# Patient Record
Sex: Female | Born: 1990 | Race: White | Hispanic: No | Marital: Married | Smoking: Never smoker
Health system: Southern US, Community
[De-identification: ages and names within clinical notes are randomized; demographics above are authoritative.]

## PROBLEM LIST (undated history)

## (undated) DIAGNOSIS — F5000 Anorexia nervosa, unspecified: Secondary | ICD-10-CM

## (undated) DIAGNOSIS — J45909 Unspecified asthma, uncomplicated: Secondary | ICD-10-CM

## (undated) DIAGNOSIS — F419 Anxiety disorder, unspecified: Secondary | ICD-10-CM

## (undated) DIAGNOSIS — F909 Attention-deficit hyperactivity disorder, unspecified type: Secondary | ICD-10-CM

## (undated) DIAGNOSIS — L309 Dermatitis, unspecified: Secondary | ICD-10-CM

## (undated) DIAGNOSIS — N760 Acute vaginitis: Secondary | ICD-10-CM

## (undated) HISTORY — DX: Dermatitis, unspecified: L30.9

## (undated) HISTORY — DX: Acute vaginitis: N76.0

## (undated) HISTORY — DX: Anxiety disorder, unspecified: F41.9

## (undated) HISTORY — DX: Unspecified asthma, uncomplicated: J45.909

## (undated) HISTORY — DX: Anorexia nervosa, unspecified: F50.00

## (undated) HISTORY — DX: Attention-deficit hyperactivity disorder, unspecified type: F90.9

---
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
## 2012-01-12 ENCOUNTER — Ambulatory Visit (INDEPENDENT_AMBULATORY_CARE_PROVIDER_SITE_OTHER): Payer: 59 | Admitting: Psychiatry

## 2012-01-12 VITALS — BP 110/70 | Ht 60.0 in | Wt 141.0 lb

## 2012-01-12 DIAGNOSIS — F5000 Anorexia nervosa, unspecified: Secondary | ICD-10-CM

## 2012-01-12 DIAGNOSIS — F909 Attention-deficit hyperactivity disorder, unspecified type: Secondary | ICD-10-CM

## 2012-01-12 DIAGNOSIS — F411 Generalized anxiety disorder: Secondary | ICD-10-CM

## 2012-01-12 MED ORDER — METHYLPHENIDATE HCL ER (OSM) 27 MG PO TBCR: 27.0000 mg | EXTENDED_RELEASE_TABLET | ORAL | Status: DC

## 2012-01-12 MED ORDER — FLUOXETINE HCL 10 MG PO CAPS: 30.0000 mg | ORAL_CAPSULE | Freq: Every day | ORAL | Status: DC

## 2012-01-13 ENCOUNTER — Encounter (HOSPITAL_COMMUNITY): Payer: Self-pay | Admitting: Psychiatry

## 2012-01-13 NOTE — Progress Notes (Signed)
   [facility] Follow-up Outpatient Visit  Ariel Cervantes 1990-09-27   Subjective: The patient is an 21 year old female who has been followed by [facility] since August of 2010. She is currently diagnosed with anorexia nervosa along with generalized anxiety disorder. At her last appointment, I continued her Prozac. The patient restarted the Concerta when school started. She is now attending [employer]. It appears to be going well. She got a 96 on her first biology exam. She is taking 16 hours this semester. She has been a little bit more moody lately. She has been on birth control pills for approximately one and a half months. She is the same weight his last visit. She reports that her dad is asking if she can go up on her Prozac. She denies any restricting or binging. She has been exercising appropriately.  Filed Vitals:   01/13/12 0843  BP: 110/70    Mental Status Examination  Appearance: Casual Alert: Yes Attention: good  Cooperative: Yes Eye Contact: Good Speech: Regular rate rhythm and volume Psychomotor Activity: Normal Memory/Concentration: Intact Oriented: person, place, time/date and situation Mood: Euthymic Affect: Appropriate Thought Processes and Associations: Logical Fund of Knowledge: Fair Thought Content: No suicidal or homicidal thoughts Insight: Fair Judgement: Fair  Diagnosis: Anorexia nervosa, generalized anxiety disorder  Treatment Plan: We will increase the Prozac to 30 mg daily. I will continue Concerta 27 mg. I will see her back in 6 weeks. Patient may call with concerns. Jamse Mead, MD

## 2012-02-23 ENCOUNTER — Encounter (HOSPITAL_COMMUNITY): Payer: Self-pay | Admitting: Psychiatry

## 2012-02-23 ENCOUNTER — Ambulatory Visit (INDEPENDENT_AMBULATORY_CARE_PROVIDER_SITE_OTHER): Payer: 59 | Admitting: Psychiatry

## 2012-02-23 VITALS — BP 118/68 | Ht 60.0 in | Wt 142.0 lb

## 2012-02-23 DIAGNOSIS — F909 Attention-deficit hyperactivity disorder, unspecified type: Secondary | ICD-10-CM

## 2012-02-23 DIAGNOSIS — F5000 Anorexia nervosa, unspecified: Secondary | ICD-10-CM

## 2012-02-23 DIAGNOSIS — Z8659 Personal history of other mental and behavioral disorders: Secondary | ICD-10-CM

## 2012-02-23 DIAGNOSIS — F411 Generalized anxiety disorder: Secondary | ICD-10-CM

## 2012-02-23 MED ORDER — METHYLPHENIDATE HCL ER (OSM) 27 MG PO TBCR: 27.0000 mg | EXTENDED_RELEASE_TABLET | ORAL | Status: DC

## 2012-02-23 NOTE — Progress Notes (Signed)
   [facility] Follow-up Outpatient Visit  MISTEY HOFFERT [birth year]   Subjective: The patient is an 21 year old female who has been followed by [facility] since August of 2010. She is currently diagnosed with anorexia nervosa along with generalized anxiety disorder and ADHD inattentive type. At her last appointment, I increased her Prozac to 30 mg daily. I continued her Concerta 27 mg daily. The patient is doing well in school. She currently has a B. in biology but believe she can forget 2 and a with the lab. She is also taking developmental psychology, English, Spanish, and history. She still living at home. She is now adjusting to it. She was concerned about her weight because she feels that she eats more when she is home. She is only a pound today and that is since June. She does undergo test anxiety. She can sometimes psych herself out over exams. She sleeping well. She's been having bad dreams that she should not go to medical school. She is doing well with the increased dose of Prozac.  Filed Vitals:   02/23/12 1418  BP: 118/68    Mental Status Examination  Appearance: Casual Alert: Yes Attention: good  Cooperative: Yes Eye Contact: Good Speech: Regular rate rhythm and volume Psychomotor Activity: Normal Memory/Concentration: Intact Oriented: person, place, time/date and situation Mood: Euthymic Affect: Appropriate Thought Processes and Associations: Logical Fund of Knowledge: Fair Thought Content: No suicidal or homicidal thoughts Insight: Fair Judgement: Fair  Diagnosis: Anorexia nervosa, generalized anxiety disorder  Treatment Plan: I will continue the Prozac 30 mg daily. I will continue Concerta 27 mg. I will see her back in 6 weeks. Patient may call with concerns. Jamse Mead, MD

## 2012-05-24 ENCOUNTER — Ambulatory Visit (INDEPENDENT_AMBULATORY_CARE_PROVIDER_SITE_OTHER): Payer: 59 | Admitting: Psychiatry

## 2012-05-24 VITALS — BP 115/70 | Ht 60.0 in | Wt 139.0 lb

## 2012-05-24 DIAGNOSIS — F5000 Anorexia nervosa, unspecified: Secondary | ICD-10-CM

## 2012-05-24 DIAGNOSIS — F411 Generalized anxiety disorder: Secondary | ICD-10-CM

## 2012-05-24 DIAGNOSIS — F5001 Anorexia nervosa, restricting type: Secondary | ICD-10-CM

## 2012-05-24 MED ORDER — METHYLPHENIDATE HCL ER (OSM) 27 MG PO TBCR: 27.0000 mg | EXTENDED_RELEASE_TABLET | ORAL | Status: DC

## 2012-05-24 MED ORDER — METHYLPHENIDATE HCL ER (OSM) 27 MG PO TBCR
27.0000 mg | EXTENDED_RELEASE_TABLET | ORAL | Status: DC
Start: 2012-07-23 — End: 2015-07-15

## 2012-05-25 ENCOUNTER — Encounter (HOSPITAL_COMMUNITY): Payer: Self-pay | Admitting: Psychiatry

## 2012-05-25 NOTE — Progress Notes (Signed)
   [facility] Follow-up Outpatient Visit  XIADANI DAMMAN 1991-01-12   Subjective: The patient is an 22 year old female who has been followed by [facility] since August of 2010. She is currently diagnosed with anorexia nervosa along with generalized anxiety disorder and ADHD inattentive type. At her last appointment, I did not make any changes. She presents today. She made both Dean's List and Chancellor's List last semester. She is very proud of herself. She continues at [employer]. She is currently enrolled in 17 hours. She has been talking to obtain time. Her parents did not know. She finds her parents to continue to be overprotective. Her dad got upset the other day because she did not get home until 11:30. She had gone to a movie. The patient finds that when she has school from 9 AM until 2 PM, she comes home exhausted. She requires a nap. She is wondering if this is related to her medication. The patient endorses good sleep and appetite. She is down 3 pounds today, which she surprised about. She states she ate a lot over winter break. She is actually eating like a normal teenager.  Filed Vitals:   05/25/12 0847  BP: 115/70    Mental Status Examination  Appearance: Casual Alert: Yes Attention: good  Cooperative: Yes Eye Contact: Good Speech: Regular rate rhythm and volume Psychomotor Activity: Normal Memory/Concentration: Intact Oriented: person, place, time/date and situation Mood: Euthymic Affect: Appropriate Thought Processes and Associations: Logical Fund of Knowledge: Fair Thought Content: No suicidal or homicidal thoughts Insight: Fair Judgement: Fair  Diagnosis: Anorexia nervosa, generalized anxiety disorder  Treatment Plan: I will continue the Prozac 30 mg daily and the Concerta 27 mg. I will see her back in 3 months. Patient may call with concerns. Jamse Mead, MD

## 2012-07-31 ENCOUNTER — Other Ambulatory Visit (HOSPITAL_COMMUNITY): Payer: Self-pay | Admitting: Psychiatry

## 2012-08-23 ENCOUNTER — Ambulatory Visit (INDEPENDENT_AMBULATORY_CARE_PROVIDER_SITE_OTHER): Payer: 59 | Admitting: Psychiatry

## 2012-08-23 ENCOUNTER — Encounter (HOSPITAL_COMMUNITY): Payer: Self-pay | Admitting: Psychiatry

## 2012-08-23 VITALS — BP 105/68 | Ht 60.0 in | Wt 142.0 lb

## 2012-08-23 DIAGNOSIS — F411 Generalized anxiety disorder: Secondary | ICD-10-CM

## 2012-08-23 DIAGNOSIS — F909 Attention-deficit hyperactivity disorder, unspecified type: Secondary | ICD-10-CM

## 2012-08-23 DIAGNOSIS — F5000 Anorexia nervosa, unspecified: Secondary | ICD-10-CM

## 2012-08-23 MED ORDER — METHYLPHENIDATE HCL ER (OSM) 27 MG PO TBCR: 27.0000 mg | EXTENDED_RELEASE_TABLET | ORAL | Status: DC

## 2012-08-23 MED ORDER — FLUOXETINE HCL 40 MG PO CAPS: 40.0000 mg | ORAL_CAPSULE | Freq: Every day | ORAL | Status: DC

## 2012-08-23 NOTE — Progress Notes (Signed)
Health Follow-up Outpatient Visit  CASSARA NIDA February 08, 1991   Subjective: The patient is an 22 year old female who has been followed by [facility] since August of 2010. She is currently diagnosed with anorexia nervosa along with generalized anxiety disorder and ADHD inattentive type. At her last appointment, I did not make any changes. She presents today. She is struggling more this semester. She dropped a three-hour class that she's only taking 14 hours. Her anxiety has been higher. She plans on taking organic chemistry in the fall. She's been dating since January. It appears to be going well. Appetite is good. She is actually up 3 pounds at today's visit. She is careful not to over monitor. She is sleeping well. She has been spending a lot of time studying. She was at [employer] yesterday for 5 hours. Afterwards she went out with a friend. She did not get home until 11 PM. Dad was not happy about this. She and mom are doing well. Dad is back to working full time, which is a good thing. The patient continues to live at home.  Filed Vitals:   08/23/12 1424  BP: 105/68   Active Ambulatory Problems    Diagnosis Date Noted  . Anorexia nervosa, restricting type 07/10/2011  . GAD (generalized anxiety disorder) 07/10/2011   Resolved Ambulatory Problems    Diagnosis Date Noted  . No Resolved Ambulatory Problems   Past Medical History  Diagnosis Date  . ADHD (attention deficit hyperactivity disorder)   . Anxiety   . Anorexia nervosa    Current Outpatient Prescriptions on File Prior to Visit  Medication Sig Dispense Refill  . AVIANE 0.1-20 MG-MCG tablet       . methylphenidate (CONCERTA) 27 MG CR tablet Take 1 tablet (27 mg total) by mouth every morning.  30 tablet  0  . methylphenidate (CONCERTA) 27 MG CR tablet Take 1 tablet (27 mg total) by mouth every morning.  30 tablet  0  . methylphenidate (CONCERTA) 27 MG CR tablet Take 1 tablet (27 mg total)  by mouth every morning. Fill after 02/11/12  30 tablet  0  . methylphenidate (CONCERTA) 27 MG CR tablet Take 1 tablet (27 mg total) by mouth every morning.  30 tablet  0  . methylphenidate (CONCERTA) 27 MG CR tablet Take 1 tablet (27 mg total) by mouth every morning. Fill after 03/24/12  30 tablet  0  . methylphenidate (CONCERTA) 27 MG CR tablet Take 1 tablet (27 mg total) by mouth every morning. Fill after 04/23/12  30 tablet  0  . methylphenidate (CONCERTA) 27 MG CR tablet Take 1 tablet (27 mg total) by mouth every morning.  30 tablet  0  . methylphenidate (CONCERTA) 27 MG CR tablet Take 1 tablet (27 mg total) by mouth every morning. Fill after 06/23/12  30 tablet  0  . methylphenidate (CONCERTA) 27 MG CR tablet Take 1 tablet (27 mg total) by mouth every morning. Fill after 07/23/12  30 tablet  0  . [DISCONTINUED] traZODone (DESYREL) 50 MG tablet Take 50 mg by mouth at bedtime. 1-2 QHS       No current facility-administered medications on file prior to visit.  Review of Systems - General ROS: negative for - sleep disturbance or weight loss Psychological ROS: positive for - anxiety Cardiovascular ROS: no chest pain or dyspnea on exertion Musculoskeletal ROS: negative for - gait disturbance or muscular weakness Neurological ROS: negative for - headaches or seizures  Mental  Status Examination  Appearance: Casual Alert: Yes Attention: good  Cooperative: Yes Eye Contact: Good Speech: Regular rate rhythm and volume Psychomotor Activity: Normal Memory/Concentration: Intact Oriented: person, place, time/date and situation Mood: Euthymic Affect: Appropriate Thought Processes and Associations: Logical Fund of Knowledge: Fair Thought Content: No suicidal or homicidal thoughts Insight: Fair Judgement: Fair  Diagnosis: Anorexia nervosa, generalized anxiety disorder  Treatment Plan: I will increase Prozac to 40 mg daily. I will continue the Concerta 27 mg. I will see her back in 6 weeks.  Patient may call with concerns. Jamse Mead, MD

## 2012-10-04 ENCOUNTER — Ambulatory Visit (HOSPITAL_COMMUNITY): Payer: Self-pay | Admitting: Psychiatry

## 2012-10-12 ENCOUNTER — Encounter (HOSPITAL_COMMUNITY): Payer: Self-pay | Admitting: Psychiatry

## 2012-10-12 ENCOUNTER — Ambulatory Visit (INDEPENDENT_AMBULATORY_CARE_PROVIDER_SITE_OTHER): Payer: 59 | Admitting: Psychiatry

## 2012-10-12 VITALS — BP 102/64 | Ht 60.0 in | Wt 138.0 lb

## 2012-10-12 DIAGNOSIS — F411 Generalized anxiety disorder: Secondary | ICD-10-CM

## 2012-10-12 DIAGNOSIS — F909 Attention-deficit hyperactivity disorder, unspecified type: Secondary | ICD-10-CM

## 2012-10-12 MED ORDER — METHYLPHENIDATE HCL ER (CD) 10 MG PO CPCR: 10.0000 mg | ORAL_CAPSULE | ORAL | Status: DC

## 2012-10-12 NOTE — Progress Notes (Signed)
[facility] Follow-up Outpatient Visit  Ariel Cervantes June 27, 1990   Subjective: The patient is an 22 year old female who has been followed by [facility] since August of 2010. She is currently diagnosed with anorexia nervosa along with generalized anxiety disorder and ADHD inattentive type. At her last appointment, I did not make any changes. She presents today. She completed her spring semester. She has all A's and B's except for a C. plus. She is maintained a 3.4 GPA. She is no longer dating. She does not feel like he was a right fit. She has a job for the summer working as a Conservation officer, nature at [employer]. The patient brings a letter from [employer]. Her stimulants as the 50s are co-pay. The letter suggests a lower cost stimulants. Parents are interested. The patient feels that she's doing better since the increase in the Prozac. Her weight is down 4 pounds today. There has been no imaging or purging. She has not been restricting. Overall she feels that she's doing well.  Filed Vitals:   10/12/12 1335  BP: 102/64   Active Ambulatory Problems    Diagnosis Date Noted  . Anorexia nervosa, restricting type 07/10/2011  . GAD (generalized anxiety disorder) 07/10/2011   Resolved Ambulatory Problems    Diagnosis Date Noted  . No Resolved Ambulatory Problems   Past Medical History  Diagnosis Date  . ADHD (attention deficit hyperactivity disorder)   . Anxiety   . Anorexia nervosa    Current Outpatient Prescriptions on File Prior to Visit  Medication Sig Dispense Refill  . AVIANE 0.1-20 MG-MCG tablet       . FLUoxetine (PROZAC) 40 MG capsule Take 1 capsule (40 mg total) by mouth daily.  90 capsule  1  . methylphenidate (CONCERTA) 27 MG CR tablet Take 1 tablet (27 mg total) by mouth every morning.  30 tablet  0  . methylphenidate (CONCERTA) 27 MG CR tablet Take 1 tablet (27 mg total) by mouth every morning.  30 tablet  0  . methylphenidate (CONCERTA)  27 MG CR tablet Take 1 tablet (27 mg total) by mouth every morning. Fill after 02/11/12  30 tablet  0  . methylphenidate (CONCERTA) 27 MG CR tablet Take 1 tablet (27 mg total) by mouth every morning.  30 tablet  0  . methylphenidate (CONCERTA) 27 MG CR tablet Take 1 tablet (27 mg total) by mouth every morning. Fill after 03/24/12  30 tablet  0  . methylphenidate (CONCERTA) 27 MG CR tablet Take 1 tablet (27 mg total) by mouth every morning. Fill after 04/23/12  30 tablet  0  . methylphenidate (CONCERTA) 27 MG CR tablet Take 1 tablet (27 mg total) by mouth every morning.  30 tablet  0  . methylphenidate (CONCERTA) 27 MG CR tablet Take 1 tablet (27 mg total) by mouth every morning. Fill after 06/23/12  30 tablet  0  . methylphenidate (CONCERTA) 27 MG CR tablet Take 1 tablet (27 mg total) by mouth every morning. Fill after 07/23/12  30 tablet  0  . methylphenidate (CONCERTA) 27 MG CR tablet Take 1 tablet (27 mg total) by mouth every morning.  30 tablet  0  . methylphenidate (CONCERTA) 27 MG CR tablet Take 1 tablet (27 mg total) by mouth every morning. Fill after 09/22/12  30 tablet  0  . [DISCONTINUED] traZODone (DESYREL) 50 MG tablet Take 50 mg by mouth at bedtime. 1-2 QHS       No  current facility-administered medications on file prior to visit.  Review of Systems - General ROS: negative for - sleep disturbance or weight loss Psychological ROS: positive for - anxiety Cardiovascular ROS: no chest pain or dyspnea on exertion Musculoskeletal ROS: negative for - gait disturbance or muscular weakness Neurological ROS: negative for - headaches or seizures  Mental Status Examination  Appearance: Casual Alert: Yes Attention: good  Cooperative: Yes Eye Contact: Good Speech: Regular rate rhythm and volume Psychomotor Activity: Normal Memory/Concentration: Intact Oriented: person, place, time/date and situation Mood: Euthymic Affect: Appropriate Thought Processes and Associations: Logical Fund of  Knowledge: Fair Thought Content: No suicidal or homicidal thoughts Insight: Fair Judgement: Fair  Diagnosis: Anorexia nervosa, generalized anxiety disorder  Treatment Plan: I will discontinue Concerta.  I will start Metadate CD 10 mg.  I will continue Prozac 40 mg daily. I will see her back in 2 months. Patient update in 3 weeks. Patient may call with concerns. Jamse Mead, MD

## 2012-12-13 ENCOUNTER — Ambulatory Visit (INDEPENDENT_AMBULATORY_CARE_PROVIDER_SITE_OTHER): Payer: 59 | Admitting: Psychiatry

## 2012-12-13 ENCOUNTER — Encounter (HOSPITAL_COMMUNITY): Payer: Self-pay | Admitting: Psychiatry

## 2012-12-13 VITALS — BP 108/72 | Ht 60.0 in | Wt 138.0 lb

## 2012-12-13 DIAGNOSIS — F909 Attention-deficit hyperactivity disorder, unspecified type: Secondary | ICD-10-CM

## 2012-12-13 DIAGNOSIS — F411 Generalized anxiety disorder: Secondary | ICD-10-CM

## 2012-12-13 DIAGNOSIS — F5001 Anorexia nervosa, restricting type: Secondary | ICD-10-CM

## 2012-12-13 DIAGNOSIS — F5000 Anorexia nervosa, unspecified: Secondary | ICD-10-CM

## 2012-12-13 MED ORDER — METHYLPHENIDATE HCL ER (OSM) 27 MG PO TBCR: 27.0000 mg | EXTENDED_RELEASE_TABLET | ORAL | Status: DC

## 2012-12-13 NOTE — Progress Notes (Signed)
[facility] Follow-up Outpatient Visit  Ariel Cervantes 08/17/1990   Subjective: The patient is an 22 year-old female who has been followed by [facility] since August of 2010. She is currently diagnosed with anorexia nervosa along with generalized anxiety disorder and ADHD inattentive type. At her last appointment, I switched her to shorter acting Metadate CD rather than Concerta secondary to co-pay issues. The insurance refused the Metadate CD. Patient presents today. She never started the Metadate CD. She has continued on the Prozac for the summer. The patient will be going back to school on Monday. She continues to work at [employer]. The patient is getting out and being social. She is signed up for all morning classes so she may continue to work. The patient plans on joining an honor society along with rushing a sorority. She is sleeping and eating well. Her weight is the same as last appointment. The patient would rather just stick with the Concerta for her stimulant. She feels that she does well with it.  Filed Vitals:   12/13/12 1536  BP: 108/72   Active Ambulatory Problems    Diagnosis Date Noted  . Anorexia nervosa, restricting type 07/10/2011  . GAD (generalized anxiety disorder) 07/10/2011   Resolved Ambulatory Problems    Diagnosis Date Noted  . No Resolved Ambulatory Problems   Past Medical History  Diagnosis Date  . ADHD (attention deficit hyperactivity disorder)   . Anxiety   . Anorexia nervosa    Current Outpatient Prescriptions on File Prior to Visit  Medication Sig Dispense Refill  . AVIANE 0.1-20 MG-MCG tablet       . FLUoxetine (PROZAC) 40 MG capsule Take 1 capsule (40 mg total) by mouth daily.  90 capsule  1  . methylphenidate (CONCERTA) 27 MG CR tablet Take 1 tablet (27 mg total) by mouth every morning.  30 tablet  0  . methylphenidate (CONCERTA) 27 MG CR tablet Take 1 tablet (27 mg total) by mouth every morning.  30 tablet   0  . methylphenidate (CONCERTA) 27 MG CR tablet Take 1 tablet (27 mg total) by mouth every morning.  30 tablet  0  . methylphenidate (CONCERTA) 27 MG CR tablet Take 1 tablet (27 mg total) by mouth every morning. Fill after 03/24/12  30 tablet  0  . methylphenidate (CONCERTA) 27 MG CR tablet Take 1 tablet (27 mg total) by mouth every morning. Fill after 04/23/12  30 tablet  0  . methylphenidate (CONCERTA) 27 MG CR tablet Take 1 tablet (27 mg total) by mouth every morning.  30 tablet  0  . methylphenidate (CONCERTA) 27 MG CR tablet Take 1 tablet (27 mg total) by mouth every morning. Fill after 06/23/12  30 tablet  0  . methylphenidate (CONCERTA) 27 MG CR tablet Take 1 tablet (27 mg total) by mouth every morning. Fill after 07/23/12  30 tablet  0  . methylphenidate (CONCERTA) 27 MG CR tablet Take 1 tablet (27 mg total) by mouth every morning.  30 tablet  0  . methylphenidate (METADATE CD) 10 MG CR capsule Take 1 capsule (10 mg total) by mouth every morning.  30 capsule  0  . [DISCONTINUED] traZODone (DESYREL) 50 MG tablet Take 50 mg by mouth at bedtime. 1-2 QHS       No current facility-administered medications on file prior to visit.  Review of Systems - General ROS: negative for - sleep disturbance or weight loss Psychological ROS: positive for -  anxiety Cardiovascular ROS: no chest pain or dyspnea on exertion Musculoskeletal ROS: negative for - gait disturbance or muscular weakness Neurological ROS: negative for - headaches or seizures  Mental Status Examination  Appearance: Casual Alert: Yes Attention: good  Cooperative: Yes Eye Contact: Good Speech: Regular rate rhythm and volume Psychomotor Activity: Normal Memory/Concentration: Intact Oriented: person, place, time/date and situation Mood: Euthymic Affect: Appropriate Thought Processes and Associations: Logical Fund of Knowledge: Fair Thought Content: No suicidal or homicidal thoughts Insight: Fair Judgement: Fair  Diagnosis:  Anorexia nervosa, generalized anxiety disorder  Treatment Plan: I will continue Concerta 27 mg daily.  I will continue Prozac 40 mg daily. I will see her back in 3 months.  Patient may call with concerns. Jamse Mead, MD

## 2013-03-15 ENCOUNTER — Ambulatory Visit (INDEPENDENT_AMBULATORY_CARE_PROVIDER_SITE_OTHER): Payer: 59 | Admitting: Psychiatry

## 2013-03-15 ENCOUNTER — Ambulatory Visit (HOSPITAL_COMMUNITY): Payer: Self-pay | Admitting: Psychiatry

## 2013-03-15 ENCOUNTER — Encounter (HOSPITAL_COMMUNITY): Payer: Self-pay | Admitting: Psychiatry

## 2013-03-15 VITALS — BP 107/56 | HR 60 | Ht 60.0 in | Wt 142.0 lb

## 2013-03-15 DIAGNOSIS — F5000 Anorexia nervosa, unspecified: Secondary | ICD-10-CM

## 2013-03-15 DIAGNOSIS — F411 Generalized anxiety disorder: Secondary | ICD-10-CM

## 2013-03-15 DIAGNOSIS — F5001 Anorexia nervosa, restricting type: Secondary | ICD-10-CM

## 2013-03-15 MED ORDER — METHYLPHENIDATE HCL ER (OSM) 27 MG PO TBCR: 27.0000 mg | EXTENDED_RELEASE_TABLET | ORAL | Status: DC

## 2013-03-15 MED ORDER — FLUOXETINE HCL 40 MG PO CAPS: 40.0000 mg | ORAL_CAPSULE | Freq: Every day | ORAL | Status: DC

## 2013-03-15 NOTE — Progress Notes (Signed)
[facility] [id number] Progress Note  Ariel Cervantes [id number] 22 y.o.  03/15/2013 3:16 PM  Chief Complaint: Follow up  History of Present Illness: HPI Comments: Ariel Cervantes is  a 22 y/o female with a past psychiatric history significant for Anorexia nervosa, restricting type,   Generalized Anxiety Disorder. The patient is referred for psychiatric services for psychiatric evaluation and medication management.    . Location: The patient reports she has been doing well over.  . Quality: The patient reports she has been in school since August.  She has not been taking Concerta on a regular basis.   The patient reports that her main stressors are:   In the area of affective symptoms, patient appears midly anxious. Patient denies current suicidal ideation, intent, or plan. Patient denies current homicidal ideation, intent, or plan. Patient denies auditory hallucinations. Patient endorses/denies visual hallucinations. Patient denies symptoms of paranoia. Patient states sleep is good, with approximately 6-8 hours of sleep per night. Appetite is good. Energy level is good. Patient denies symptoms of anhedonia. Patient denies hopelessness, helplessness, or guilt.   . Severity; Depression: 7/10 (0=Very depressed; 5=Neutral; 10=Very Happy)  Anxiety- 5/10 (0=no anxiety; 5= moderate/tolerable anxiety; 10= panic attacks)  . Duration: Patient reports symptoms since the age.  . Timing-Worse around exam days.  . Context: Arguments with her father.  . Modifying factors: Thinking about happy things.  . Associated signs and symptoms (e.g., loss of appetite, loss of weight, loss of sexual interest)  Denies any recent episodes consistent with mania, particularly decreased need for sleep with increased energy, grandiosity, impulsivity, hyperverbal and pressured speech, or increased productivity. Denies any recent symptoms consistent with psychosis, particularly auditory or visual  hallucinations, thought broadcasting/insertion/withdrawal, or ideas of reference. Also denies  any panic attacks. Denies any history of trauma or symptoms consistent with PTSD such as flashbacks, nightmares, hypervigilance, feelings of numbness or inability to connect with others.   Suicidal Ideation: Negative Plan Formed: Negative Patient has means to carry out plan: Negative  Homicidal Ideation: Negative Plan Formed: Negative Patient has means to carry out plan: Negative  Review of Systems: Psychiatric: Agitation: Negative Hallucination: Negative Depressed Mood: Negative Insomnia: Negative Hypersomnia: Negative Altered Concentration: Negative Feels Worthless: Negative Grandiose Ideas: Negative Belief In Special Powers: Negative New/Increased Substance Abuse: Negative Compulsions: Negative  Neurologic: Headache: Negative Seizure: Negative Paresthesias: Negative  Past Medical Family, Social History:  Active Ambulatory Problems    Diagnosis Date Noted  . Anorexia nervosa, restricting type 07/10/2011  . GAD (generalized anxiety disorder) 07/10/2011   Resolved Ambulatory Problems    Diagnosis Date Noted  . No Resolved Ambulatory Problems   Past Medical History  Diagnosis Date  . ADHD (attention deficit hyperactivity disorder)   . Anxiety   . Anorexia nervosa    Family History  Problem Relation Age of Onset  . Depression Father   . Anxiety disorder Father      Outpatient Encounter Prescriptions as of 03/15/2013  Medication Sig  . AVIANE 0.1-20 MG-MCG tablet   . FLUoxetine (PROZAC) 40 MG capsule Take 1 capsule (40 mg total) by mouth daily.  . methylphenidate (CONCERTA) 27 MG CR tablet Take 1 tablet (27 mg total) by mouth every morning.  . methylphenidate (CONCERTA) 27 MG CR tablet Take 1 tablet (27 mg total) by mouth every morning.  . methylphenidate (CONCERTA) 27 MG CR tablet Take 1 tablet (27 mg total) by mouth every morning.  . methylphenidate (CONCERTA) 27 MG CR  tablet Take 1 tablet (27  mg total) by mouth every morning. Fill after 03/24/12  . methylphenidate (CONCERTA) 27 MG CR tablet Take 1 tablet (27 mg total) by mouth every morning. Fill after 04/23/12  . methylphenidate (CONCERTA) 27 MG CR tablet Take 1 tablet (27 mg total) by mouth every morning.  . methylphenidate (CONCERTA) 27 MG CR tablet Take 1 tablet (27 mg total) by mouth every morning. Fill after 06/23/12  . methylphenidate (CONCERTA) 27 MG CR tablet Take 1 tablet (27 mg total) by mouth every morning. Fill after 07/23/12  . methylphenidate (CONCERTA) 27 MG CR tablet Take 1 tablet (27 mg total) by mouth every morning.  . methylphenidate (CONCERTA) 27 MG CR tablet Take 1 tablet (27 mg total) by mouth every morning.  . methylphenidate (CONCERTA) 27 MG CR tablet Take 1 tablet (27 mg total) by mouth every morning.  . methylphenidate (CONCERTA) 27 MG CR tablet Take 1 tablet (27 mg total) by mouth every morning.  . methylphenidate (METADATE CD) 10 MG CR capsule Take 1 capsule (10 mg total) by mouth every morning.    Past Psychiatric History/Hospitalization(s): Anxiety: Negative Bipolar Disorder: Negative Depression: Negative Mania: Negative Psychosis: Negative Schizophrenia: Negative Personality Disorder: Negative Hospitalization for psychiatric illness: Negative History of Electroconvulsive Shock Therapy: Negative Prior Suicide Attempts: Negative  Physical Exam: Constitutional:  BP 107/56  Pulse 60  Ht 5' (1.524 m)  Wt 142 lb (64.411 kg)  BMI 27.73 kg/m2  LMP 03/15/2013  General Appearance: alert, oriented, no acute distress and well nourished  Musculoskeletal: Strength & Muscle Tone: within normal limits Gait & Station: normal Patient leans: N/A  Psychiatric: General Appearance: Casual and Well Groomed  Patent attorney::  Negative  Speech:  Negative  Volume:  Normal  Mood:  Euthymic  Affect:  Negative  Thought Process:  Negative  Orientation:  Full (Time, Place, and Person)   Thought Content:  WDL  Suicidal Thoughts:  No  Homicidal Thoughts:  No  Memory:  Immediate;   Good Recent;   Fair Remote;   Fair  Judgement:  Fair  Insight:  Fair  Psychomotor Activity:  Normal  Concentration:  Negative  Recall:  Negative  Akathisia:  Negative  Handed:  Right  AIMS (if indicated):   Not indicated, no abnormal movement  Assets:  Communication Skills Desire for Improvement Financial Resources/Insurance Housing Leisure Time Physical Health Resilience Social Support Talents/Skills Transportation Vocational/Educational  Sleep:  Number of Hours:     Medical Decision Making (Choose Three): Established Problem, Stable/Improving (1) and Review of Medication Regimen & Side Effects (2)  Assessment: Axis I: Anorexia nervosa, restricting type, Generalized Anxiety Disorder  Plan:   Plan of Care:  PLAN:  1. Affirm with the patient that the medications are taken as ordered. Patient  expressed understanding of how their medications were to be used.    Laboratory:  No labs warranted at this time.    Psychotherapy: Therapy: brief supportive therapy provided.  Discussed psychosocial stressors in detail.  Will refer to individual therapy. Continue individual therapy.  Medications:  Continue the following psychiatric medications as written prior to this appointment with the following changes:  a) FLUoxetine (PROZAC) 40 MG capsule b) methylphenidate (CONCERTA) 27 MG CR tablet  -Risks and benefits, side effects and alternatives discussed with patient, she was given an opportunity to ask questions about his/her medication, illness, and treatment. All current psychiatric medications have been reviewed and discussed with the patient and adjusted as clinically appropriate. The patient has been provided an accurate and updated list of  the medications being now prescribed.   Routine PRN Medications:  Negative  Consultations: The patient was encouraged to keep all PCP and  specialty clinic appointments.   Safety Concerns:   Patient told to call clinic if any problems occur. Patient advised to go to  ER  if she should develop SI/HI, side effects, or if symptoms worsen. Has crisis numbers to call if needed.    Other:   8. Patient was instructed to return to clinic in 1 month.  9. The patient was advised to call and cancel their mental health appointment within 24 hours of the appointment, if they are unable to keep the appointment, as well as the three no show and termination from clinic policy. 10. The patient expressed understanding of the plan and agrees with the above.   Jacqulyn Cane, MD 03/15/2013

## 2013-03-17 ENCOUNTER — Encounter (HOSPITAL_COMMUNITY): Payer: Self-pay | Admitting: Psychiatry

## 2013-06-15 ENCOUNTER — Ambulatory Visit (HOSPITAL_COMMUNITY): Payer: Self-pay | Admitting: Psychiatry

## 2013-11-13 DIAGNOSIS — F50029 Anorexia nervosa, binge eating/purging type, unspecified: Secondary | ICD-10-CM | POA: Insufficient documentation

## 2013-11-13 DIAGNOSIS — F900 Attention-deficit hyperactivity disorder, predominantly inattentive type: Secondary | ICD-10-CM | POA: Insufficient documentation

## 2013-11-13 DIAGNOSIS — F5002 Anorexia nervosa, binge eating/purging type: Secondary | ICD-10-CM | POA: Insufficient documentation

## 2014-06-04 ENCOUNTER — Encounter (HOSPITAL_COMMUNITY): Payer: Self-pay

## 2014-06-04 ENCOUNTER — Emergency Department (HOSPITAL_COMMUNITY)
Admission: EM | Admit: 2014-06-04 | Discharge: 2014-06-04 | Disposition: A | Discharge status: Home / Self Care | Payer: Managed Care, Other (non HMO) | Source: Home / Self Care | Attending: Family Medicine | Admitting: Family Medicine

## 2014-06-04 DIAGNOSIS — N760 Acute vaginitis: Secondary | ICD-10-CM

## 2014-06-04 NOTE — ED Provider Notes (Signed)
CSN: [id number]     Arrival date & time 06/04/14  1511 History   First MD Initiated Contact with Patient 06/04/14 1648     Chief Complaint  Patient presents with  . Foreign Body in Vagina   (Consider location/radiation/quality/duration/timing/severity/associated sxs/prior Treatment) Patient is a 24 y.o. female presenting with foreign body in vagina.  Foreign Body in Vagina Pertinent negatives include no shortness of breath.    Patient presents with concern for vaginal foreign body. Patient explains that her period is finishing one day ago and she is unsure if she removed her tampon last night. This morning she tried to check to see if it was there and scratched herself causing slight bleeding. Since that time she's had the sensation that she has a retained vaginal foreign body. She denies any vaginal itching, discharge, or concern for STI. She is unsure if the foreign body came out earlier when she went to bathroom.  Gonorrhea chlamydia and other STD screening were all offered and declined.  Additionally she denies fever, chills, sweats, change in appetite, dyspnea, or chest pain.   Past Medical History  Diagnosis Date  . ADHD (attention deficit hyperactivity disorder)   . Anxiety   . Anorexia nervosa    History reviewed. No pertinent past surgical history. Family History  Problem Relation Age of Onset  . Depression Father   . Anxiety disorder Father    History  Substance Use Topics  . Smoking status: Never Smoker   . Smokeless tobacco: Never Used  . Alcohol Use: 2.0 oz/week    4 drink(s) per week   OB History    No data available     Review of Systems  Constitutional: Negative for fever and chills.  Respiratory: Negative for shortness of breath.   Genitourinary: Positive for vaginal bleeding. Negative for vaginal discharge, difficulty urinating and pelvic pain.    Allergies  Review of patient's allergies indicates no known allergies.  Home Medications   Prior to  Admission medications   Medication Sig Start Date End Date Taking? Authorizing Provider  AVIANE 0.1-20 MG-MCG tablet  02/20/12   Historical Provider, MD  FLUoxetine (PROZAC) 40 MG capsule Take 1 capsule (40 mg total) by mouth daily. 03/15/13   Larena SoxShaji J Puthuvel, MD  methylphenidate (CONCERTA) 27 MG CR tablet Take 1 tablet (27 mg total) by mouth every morning. 10/08/11   Jamse MeadMary Patricia Moore, MD  methylphenidate (CONCERTA) 27 MG CR tablet Take 1 tablet (27 mg total) by mouth every morning. 01/12/12   Jamse MeadMary Patricia Moore, MD  methylphenidate (CONCERTA) 27 MG CR tablet Take 1 tablet (27 mg total) by mouth every morning. 02/23/12   Jamse MeadMary Patricia Moore, MD  methylphenidate (CONCERTA) 27 MG CR tablet Take 1 tablet (27 mg total) by mouth every morning. Fill after 03/24/12 03/24/12   Jamse MeadMary Patricia Moore, MD  methylphenidate (CONCERTA) 27 MG CR tablet Take 1 tablet (27 mg total) by mouth every morning. Fill after 04/23/12 04/23/12   Jamse MeadMary Patricia Moore, MD  methylphenidate (CONCERTA) 27 MG CR tablet Take 1 tablet (27 mg total) by mouth every morning. 05/24/12   Jamse MeadMary Patricia Moore, MD  methylphenidate (CONCERTA) 27 MG CR tablet Take 1 tablet (27 mg total) by mouth every morning. Fill after 06/23/12 06/23/12   Jamse MeadMary Patricia Moore, MD  methylphenidate (CONCERTA) 27 MG CR tablet Take 1 tablet (27 mg total) by mouth every morning. Fill after 07/23/12 07/23/12   Jamse MeadMary Patricia Moore, MD  methylphenidate (CONCERTA) 27 MG CR tablet Take 1  tablet (27 mg total) by mouth every morning. 08/23/12   Jamse Mead, MD  methylphenidate (CONCERTA) 27 MG CR tablet Take 1 tablet (27 mg total) by mouth every morning. 12/13/12   Jamse Mead, MD  methylphenidate (CONCERTA) 27 MG CR tablet Take 1 tablet (27 mg total) by mouth every morning. 12/13/12   Jamse Mead, MD  methylphenidate (CONCERTA) 27 MG CR tablet Take 1 tablet (27 mg total) by mouth every morning. 03/15/13   Larena Sox, MD  methylphenidate (METADATE CD)  10 MG CR capsule Take 1 capsule (10 mg total) by mouth every morning. 10/12/12   Jamse Mead, MD   BP 130/86 mmHg  Pulse 78  Temp(Src) 98.3 F (36.8 C) (Oral)  Resp 16  Ht  (1.549 m)  Wt 150 lb (68.04 kg)  BMI 28.36 kg/m2  SpO2 100% Physical Exam  Constitutional: She appears well-developed and well-nourished. No distress.  HENT:  Head: Normocephalic and atraumatic.  Eyes: Conjunctivae are normal.  Neck: Neck supple.  Cardiovascular: Normal rate, regular rhythm and normal heart sounds.   No murmur heard. Pulmonary/Chest: Effort normal and breath sounds normal. No respiratory distress.  Genitourinary: Vagina normal. No labial fusion. There is no rash, tenderness, lesion or injury on the right labia. There is no rash, tenderness, lesion or injury on the left labia. No erythema, tenderness or bleeding in the vagina. No foreign body around the vagina. No signs of injury around the vagina. No vaginal discharge found.  Skin: She is not diaphoretic.       ED Course  Procedures (including critical care time) Labs Review Labs Reviewed - No data to display  Imaging Review No results found.   MDM   1. Vaginitis    24 year old female here with concern for vaginal foreign body. After careful exam no vaginal foreign body is found. Assurance provided and recommended Vagisil or liver cancer if needed for vaginal irritation.  Pt seen and discussed with Dr. Artis Flock and he agrees with the plan as discussed above.     Elenora Gamma, MD 06/04/14 [phone]

## 2014-06-04 NOTE — ED Notes (Signed)
Thinks her last tampon is in sideways, and cannot remove it

## 2014-06-04 NOTE — Discharge Instructions (Signed)
Great to meet you!  Everything looks normal. You can try some lubricant or vagisil for comfort, but you dont have to.  Avoid sex for 2-3 days or until you feel normal again.

## 2015-07-02 ENCOUNTER — Ambulatory Visit: Payer: Self-pay | Admitting: Pediatrics

## 2015-07-02 DIAGNOSIS — Z9189 Other specified personal risk factors, not elsewhere classified: Secondary | ICD-10-CM | POA: Insufficient documentation

## 2015-07-02 DIAGNOSIS — F988 Other specified behavioral and emotional disorders with onset usually occurring in childhood and adolescence: Secondary | ICD-10-CM | POA: Insufficient documentation

## 2015-07-02 DIAGNOSIS — K589 Irritable bowel syndrome without diarrhea: Secondary | ICD-10-CM | POA: Insufficient documentation

## 2015-07-02 DIAGNOSIS — Z8659 Personal history of other mental and behavioral disorders: Secondary | ICD-10-CM | POA: Insufficient documentation

## 2015-07-02 DIAGNOSIS — R109 Unspecified abdominal pain: Secondary | ICD-10-CM | POA: Insufficient documentation

## 2015-07-15 ENCOUNTER — Encounter: Payer: Self-pay | Admitting: Allergy and Immunology

## 2015-07-15 ENCOUNTER — Ambulatory Visit (INDEPENDENT_AMBULATORY_CARE_PROVIDER_SITE_OTHER): Payer: Managed Care, Other (non HMO) | Admitting: Allergy and Immunology

## 2015-07-15 VITALS — BP 110/70 | HR 68 | Temp 97.1°F | Resp 16 | Ht 61.0 in | Wt 151.0 lb

## 2015-07-15 DIAGNOSIS — J4540 Moderate persistent asthma, uncomplicated: Secondary | ICD-10-CM

## 2015-07-15 DIAGNOSIS — J3089 Other allergic rhinitis: Secondary | ICD-10-CM

## 2015-07-15 MED ORDER — ALBUTEROL SULFATE HFA 108 (90 BASE) MCG/ACT IN AERS: 2.0000 | INHALATION_SPRAY | RESPIRATORY_TRACT | Status: DC | PRN

## 2015-07-15 MED ORDER — AZELASTINE HCL 0.15 % NA SOLN: NASAL | Status: DC

## 2015-07-15 MED ORDER — MONTELUKAST SODIUM 10 MG PO TABS: 10.0000 mg | ORAL_TABLET | Freq: Every day | ORAL | Status: DC

## 2015-07-15 NOTE — Assessment & Plan Note (Addendum)
   Aeroallergen avoidance measures have been discussed and provided in written form.   A prescription has been provided for azelastine nasal spray, 2 sprays per nostril twice daily as needed. Proper nasal spray technique has been discussed and demonstrated.  If needed, add fluticasone nasal spray, 2 sprays per nostril daily as needed.  Nasal saline lavage (NeilMed) as needed has been recommended along with instructions for proper administration.  A prescription has been provided for montelukast 10 mg daily at bedtime.  For sinus pressure/post nasal drainage add guaifenesin 1200 mg (plus/minus pseudoephedrine 120 mg) twice daily as needed with adequate hydration. Pseudoephedrine is only to be used for short-term relief of nasal/sinus congestion. Long-term use is discouraged due to potential side effects.   If allergen avoidance measures and medications fail to adequately relieve symptoms, aeroallergen immunotherapy will be considered.

## 2015-07-15 NOTE — Assessment & Plan Note (Signed)
   A prescription has been provided for montelukast (as above).  For now, continue Qvar 80 g, 2 inhalations twice a day.  During upper respiratory tract infections or asthma flares, increase to 3 inhalations 3 times a day until symptoms have returned baseline.  To maximize pulmonary deposition, a spacer has been provided along with instructions for its proper administration with an HFA inhaler.  Subjective and objective measures of pulmonary function will be followed and the treatment plan will be adjusted accordingly.

## 2015-07-15 NOTE — Patient Instructions (Addendum)
Perennial and seasonal allergic rhinitis  Aeroallergen avoidance measures have been discussed and provided in written form.   A prescription has been provided for azelastine nasal spray, 2 sprays per nostril twice daily as needed. Proper nasal spray technique has been discussed and demonstrated.  If needed, add fluticasone nasal spray, 2 sprays per nostril daily as needed.  Nasal saline lavage (NeilMed) as needed has been recommended along with instructions for proper administration.  A prescription has been provided for montelukast 10 mg daily at bedtime.  For sinus pressure/post nasal drainage add guaifenesin 1200 mg (plus/minus pseudoephedrine 120 mg) twice daily as needed with adequate hydration. Pseudoephedrine is only to be used for short-term relief of nasal/sinus congestion. Long-term use is discouraged due to potential side effects.   If allergen avoidance measures and medications fail to adequately relieve symptoms, aeroallergen immunotherapy will be considered.  Moderate persistent asthma  A prescription has been provided for montelukast (as above).  For now, continue Qvar 80 g, 2 inhalations twice a day.  During upper respiratory tract infections or asthma flares, increase to 3 inhalations 3 times a day until symptoms have returned baseline.  To maximize pulmonary deposition, a spacer has been provided along with instructions for its proper administration with an HFA inhaler.  Subjective and objective measures of pulmonary function will be followed and the treatment plan will be adjusted accordingly.    Return in about 6 weeks (around 08/26/2015), or if symptoms worsen or fail to improve.  Reducing Pollen Exposure  The American Academy of Allergy, Asthma and Immunology suggests the following steps to reduce your exposure to pollen during allergy seasons.    1. Do not hang sheets or clothing out to dry; pollen may collect on these items. 2. Do not mow lawns or spend time  around freshly cut grass; mowing stirs up pollen. 3. Keep windows closed at night.  Keep car windows closed while driving. 4. Minimize morning activities outdoors, a time when pollen counts are usually at their highest. 5. Stay indoors as much as possible when pollen counts or humidity is high and on windy days when pollen tends to remain in the air longer. 6. Use air conditioning when possible.  Many air conditioners have filters that trap the pollen spores. 7. Use a HEPA room air filter to remove pollen form the indoor air you breathe.   Control of Mold Allergen  Mold and fungi can grow on a variety of surfaces provided certain temperature and moisture conditions exist.  Outdoor molds grow on plants, decaying vegetation and soil.  The major outdoor mold, Alternaria and Cladosporium, are found in very high numbers during hot and dry conditions.  Generally, a late Summer - Fall peak is seen for common outdoor fungal spores.  Rain will temporarily lower outdoor mold spore count, but counts rise rapidly when the rainy period ends.  The most important indoor molds are Aspergillus and Penicillium.  Dark, humid and poorly ventilated basements are ideal sites for mold growth.  The next most common sites of mold growth are the bathroom and the kitchen.  Outdoor MicrosoftMold Control 1. Use air conditioning and keep windows closed 2. Avoid exposure to decaying vegetation. 3. Avoid leaf raking. 4. Avoid grain handling. 5. Consider wearing a face mask if working in moldy areas.  Indoor Mold Control 1. Maintain humidity below 50%. 2. Clean washable surfaces with 5% bleach solution. 3. Remove sources e.g. Contaminated carpets.  Control of Dog or Cat Allergen  Avoidance is the best  way to manage a dog or cat allergy. If you have a dog or cat and are allergic to dog or cats, consider removing the dog or cat from the home. If you have a dog or cat but don't want to find it a new home, or if your family wants a  pet even though someone in the household is allergic, here are some strategies that may help keep symptoms at bay:  1. Keep the pet out of your bedroom and restrict it to only a few rooms. Be advised that keeping the dog or cat in only one room will not limit the allergens to that room. 2. Don't pet, hug or kiss the dog or cat; if you do, wash your hands with soap and water. 3. High-efficiency particulate air (HEPA) cleaners run continuously in a bedroom or living room can reduce allergen levels over time. 4. Regular use of a high-efficiency vacuum cleaner or a central vacuum can reduce allergen levels. 5. Giving your dog or cat a bath at least once a week can reduce airborne allergen.  Control of Cockroach Allergen  Cockroach allergen has been identified as an important cause of acute attacks of asthma, especially in urban settings.  There are fifty-five species of cockroach that exist in the [place], however only three, the [place], [place] species produce allergen that can affect patients with Asthma.  Allergens can be obtained from fecal particles, egg casings and secretions from cockroaches.    1. Remove food sources. 2. Reduce access to water. 3. Seal access and entry points. 4. Spray runways with 0.5-1% Diazinon or Chlorpyrifos 5. Blow boric acid power under stoves and refrigerator. 6. Place bait stations (hydramethylnon) at feeding sites.  Control of House Dust Mite Allergen  House dust mites play a major role in allergic asthma and rhinitis.  They occur in environments with high humidity wherever human skin, the food for dust mites is found. High levels have been detected in dust obtained from mattresses, pillows, carpets, upholstered furniture, bed covers, clothes and soft toys.  The principal allergen of the house dust mite is found in its feces.  A gram of dust may contain 1,000 mites and 250,000 fecal particles.  Mite antigen is easily measured in the air during  house cleaning activities.    1. Encase mattresses, including the box spring, and pillow, in an air tight cover.  Seal the zipper end of the encased mattresses with wide adhesive tape. 2. Wash the bedding in water of 130 degrees Farenheit weekly.  Avoid cotton comforters/quilts and flannel bedding: the most ideal bed covering is the dacron comforter. 3. Remove all upholstered furniture from the bedroom. 4. Remove carpets, carpet padding, rugs, and non-washable window drapes from the bedroom.  Wash drapes weekly or use plastic window coverings. 5. Remove all non-washable stuffed toys from the bedroom.  Wash stuffed toys weekly. 6. Have the room cleaned frequently with a vacuum cleaner and a damp dust-mop.  The patient should not be in a room which is being cleaned and should wait 1 hour after cleaning before going into the room. 7. Close and seal all heating outlets in the bedroom.  Otherwise, the room will become filled with dust-laden air.  An electric heater can be used to heat the room. 8. Reduce indoor humidity to less than 50%.  Do not use a humidifier.

## 2015-07-15 NOTE — Progress Notes (Signed)
New Patient Note  RE: Ariel Cervantes MRN: [id number] DOB: Apr 29, 1991 Date of Office Visit: 07/15/2015  Referring provider: Raynelle Jan., MD Primary care provider: Raynelle Jan., MD  Chief Complaint: Sinusitis; Cough; and Wheezing   History of present illness: HPI Comments: Ariel Cervantes is a 25 y.o. female who presents today for consultation of rhinosinusitis and wheezing.  She complains of maxillary pressure/pain over the frontal maxillary sinuses, nasal congestion, thick postnasal drainage, ear fullness, nasal pruritus, and ocular pruritus.  The symptoms occur year around but tend to be more severe in the spring and summertime.  She states that over the past year she has had 5 or 6 sinus infections requiring antibiotics.  She was diagnosed with exercise-induced bronchospasm is a sophomore in high school, however she has experienced coughing, chest tightness, and wheezing with increased frequency over the past 2 years.  She reports that Qvar has relieved her lower respiratory symptoms to some degree, however she typically requires albuterol rescue for wheezing chest tightness 3 or 4 times per day during upper respiratory tract infections.  She does not use a spacer device with HFA inhalers.   Assessment and plan: Perennial and seasonal allergic rhinitis  Aeroallergen avoidance measures have been discussed and provided in written form.   A prescription has been provided for azelastine nasal spray, 2 sprays per nostril twice daily as needed. Proper nasal spray technique has been discussed and demonstrated.  If needed, add fluticasone nasal spray, 2 sprays per nostril daily as needed.  Nasal saline lavage (NeilMed) as needed has been recommended along with instructions for proper administration.  A prescription has been provided for montelukast 10 mg daily at bedtime.  For sinus pressure/post nasal drainage add guaifenesin 1200 mg (plus/minus pseudoephedrine 120 mg) twice daily  as needed with adequate hydration. Pseudoephedrine is only to be used for short-term relief of nasal/sinus congestion. Long-term use is discouraged due to potential side effects.   If allergen avoidance measures and medications fail to adequately relieve symptoms, aeroallergen immunotherapy will be considered.  Moderate persistent asthma  A prescription has been provided for montelukast (as above).  For now, continue Qvar 80 g, 2 inhalations twice a day.  During upper respiratory tract infections or asthma flares, increase to 3 inhalations 3 times a day until symptoms have returned baseline.  To maximize pulmonary deposition, a spacer has been provided along with instructions for its proper administration with an HFA inhaler.  Subjective and objective measures of pulmonary function will be followed and the treatment plan will be adjusted accordingly.    Meds ordered this encounter  Medications  . Azelastine HCl 0.15 % SOLN    Sig: TWO SPRAYS EACH NOSTRIL TWICE A DAY FOR NASAL CONGESTION OR DRAINAGE.    Dispense:  30 mL    Refill:  5  . montelukast (SINGULAIR) 10 MG tablet    Sig: Take 1 tablet (10 mg total) by mouth at bedtime.    Dispense:  30 tablet    Refill:  5  . albuterol (PROAIR HFA) 108 (90 Base) MCG/ACT inhaler    Sig: Inhale 2 puffs into the lungs every 4 (four) hours as needed for wheezing or shortness of breath.    Dispense:  1 Inhaler    Refill:  3     Diagnositics: Spirometry: FVC was 3.86 L and FEV1 was 3.31 L. Allergy skin testing: Positive to grass pollen, tree pollen, molds, cat hair, cockroach, and dust mite antigen.    Physical  examination: Blood pressure 110/70, pulse 68, temperature 97.1 F (36.2 C), temperature source Oral, resp. rate 16, height  (1.549 m), weight 151 lb (68.493 kg).  General: Alert, interactive, in no acute distress. HEENT: TMs pearly gray, turbinates edematous with thick discharge, post-pharynx erythematous. Neck: Supple  without lymphadenopathy. Lungs: Clear to auscultation without wheezing, rhonchi or rales. CV: Normal S1, S2 without murmurs. Abdomen: Nondistended, nontender. Skin: Warm and dry, without lesions or rashes. Extremities:  No clubbing, cyanosis or edema. Neuro:   Grossly intact.  Review of systems:  Review of Systems  Constitutional: Negative for fever, chills and weight loss.  HENT: Positive for congestion and sore throat. Negative for nosebleeds.   Eyes: Negative for blurred vision.  Respiratory: Positive for cough, shortness of breath and wheezing. Negative for hemoptysis.   Cardiovascular: Negative for chest pain.  Gastrointestinal: Negative for diarrhea and constipation.  Genitourinary: Negative for dysuria.  Musculoskeletal: Negative for myalgias and joint pain.  Skin: Negative for itching and rash.  Neurological: Positive for headaches. Negative for dizziness.  Endo/Heme/Allergies: Positive for environmental allergies. Does not bruise/bleed easily.    Past medical history:  Past Medical History  Diagnosis Date  . ADHD (attention deficit hyperactivity disorder)   . Anxiety   . Anorexia nervosa     Past surgical history:  History reviewed. No pertinent past surgical history.  Family history: Family History  Problem Relation Age of Onset  . Depression Father   . Anxiety disorder Father   . Allergic rhinitis Father   . Asthma Father   . Allergic rhinitis Mother   . Allergic rhinitis Brother     Social history: Social History   Social History  . Marital Status: Single    Spouse Name: N/A  . Number of Children: N/A  . Years of Education: N/A   Occupational History  . Not on file.   Social History Main Topics  . Smoking status: Never Smoker   . Smokeless tobacco: Never Used  . Alcohol Use: 2.0 oz/week    4 Standard drinks or equivalent per week  . Drug Use: No  . Sexual Activity:    Partners: Male    Birth Control/ Protection: Condom, OCP   Other Topics  Concern  . Not on file   Social History Narrative   Environmental History: The patient lives in a 23 year old apartment with carpeting in the bedroom and central air/heat.  There is a cat in the apartment. She is a nonsmoker.    Medication List       This list is accurate as of: 07/15/15  4:21 PM.  Always use your most recent med list.               albuterol 108 (90 Base) MCG/ACT inhaler  Commonly known as:  PROAIR HFA  Inhale 2 puffs into the lungs every 4 (four) hours as needed for wheezing or shortness of breath.     Azelastine HCl 0.15 % Soln  TWO SPRAYS EACH NOSTRIL TWICE A DAY FOR NASAL CONGESTION OR DRAINAGE.     beclomethasone 80 MCG/ACT inhaler  Commonly known as:  QVAR  Inhale 2 puffs into the lungs 2 (two) times daily.     cetirizine 10 MG tablet  Commonly known as:  ZYRTEC  Take 10 mg by mouth daily.     desogestrel-ethinyl estradiol 0.15-30 MG-MCG tablet  Commonly known as:  APRI,EMOQUETTE,SOLIA     dicyclomine 10 MG capsule  Commonly known as:  BENTYL  Take 10  mg by mouth.     esomeprazole 40 MG capsule  Commonly known as:  NEXIUM  Take 40 mg by mouth.     fluvoxaMINE 50 MG tablet  Commonly known as:  LUVOX  Take 50 mg by mouth.     montelukast 10 MG tablet  Commonly known as:  SINGULAIR  Take 1 tablet (10 mg total) by mouth at bedtime.     traMADol 50 MG tablet  Commonly known as:  ULTRAM  Take 50 mg by mouth.     VYVANSE 30 MG capsule  Generic drug:  lisdexamfetamine  Take 30 mg by mouth.        Known medication allergies: Allergies  Allergen Reactions  . Oseltamivir Other (See Comments)    hives  . Sulfa Antibiotics Nausea And Vomiting  . Sulfamethoxazole Nausea And Vomiting    I appreciate the opportunity to take part in this Cherrill's care. Please do not hesitate to contact me with questions.  Sincerely,   R. Jorene Guestarter Khira Cudmore, MD

## 2015-07-16 ENCOUNTER — Encounter: Payer: Self-pay | Admitting: *Deleted

## 2015-07-17 DIAGNOSIS — R011 Cardiac murmur, unspecified: Secondary | ICD-10-CM | POA: Insufficient documentation

## 2015-07-17 DIAGNOSIS — R809 Proteinuria, unspecified: Secondary | ICD-10-CM | POA: Insufficient documentation

## 2015-07-17 DIAGNOSIS — N946 Dysmenorrhea, unspecified: Secondary | ICD-10-CM | POA: Insufficient documentation

## 2015-07-29 ENCOUNTER — Telehealth: Payer: Self-pay

## 2015-07-29 NOTE — Telephone Encounter (Signed)
Patient would like to go ahead and start allergy injections.  Please Advise

## 2015-07-29 NOTE — Addendum Note (Signed)
Addended by: Donavan FoilBOBBITT, Carina Chaplin C on: 07/29/2015 06:37 PM   Modules accepted: Orders

## 2015-07-29 NOTE — Telephone Encounter (Signed)
Orders for immunotherapy have been written and submitted.  The [place] [facility] but [place].  It is unclear if she has signed a consent form, so please make sure that that is signed before initiating immunotherapy.  Thanks.

## 2015-07-30 NOTE — Telephone Encounter (Signed)
Left message for patient to call office to set up start injection appt.

## 2015-07-30 NOTE — Telephone Encounter (Signed)
Schedule start injection appt for Monday April 3 @ 4 pm. Patient does not remember signing consent. Will have her sign it on Monday.

## 2015-08-06 DIAGNOSIS — J3089 Other allergic rhinitis: Secondary | ICD-10-CM | POA: Diagnosis not present

## 2015-08-07 DIAGNOSIS — J301 Allergic rhinitis due to pollen: Secondary | ICD-10-CM | POA: Diagnosis not present

## 2015-08-09 ENCOUNTER — Ambulatory Visit (INDEPENDENT_AMBULATORY_CARE_PROVIDER_SITE_OTHER): Payer: Managed Care, Other (non HMO) | Admitting: *Deleted

## 2015-08-09 DIAGNOSIS — J309 Allergic rhinitis, unspecified: Secondary | ICD-10-CM | POA: Diagnosis not present

## 2015-08-09 MED ORDER — EPINEPHRINE 0.3 MG/0.3ML IJ SOAJ: INTRAMUSCULAR | Status: AC

## 2015-08-12 ENCOUNTER — Ambulatory Visit (INDEPENDENT_AMBULATORY_CARE_PROVIDER_SITE_OTHER): Payer: Managed Care, Other (non HMO)

## 2015-08-12 DIAGNOSIS — J309 Allergic rhinitis, unspecified: Secondary | ICD-10-CM

## 2015-08-15 ENCOUNTER — Ambulatory Visit (INDEPENDENT_AMBULATORY_CARE_PROVIDER_SITE_OTHER): Payer: Managed Care, Other (non HMO)

## 2015-08-15 DIAGNOSIS — J309 Allergic rhinitis, unspecified: Secondary | ICD-10-CM | POA: Diagnosis not present

## 2015-08-19 ENCOUNTER — Ambulatory Visit (INDEPENDENT_AMBULATORY_CARE_PROVIDER_SITE_OTHER): Payer: Managed Care, Other (non HMO)

## 2015-08-19 ENCOUNTER — Encounter: Payer: Self-pay | Admitting: Allergy and Immunology

## 2015-08-19 ENCOUNTER — Ambulatory Visit (INDEPENDENT_AMBULATORY_CARE_PROVIDER_SITE_OTHER): Payer: Managed Care, Other (non HMO) | Admitting: Allergy and Immunology

## 2015-08-19 VITALS — BP 110/75 | HR 70 | Temp 97.5°F | Resp 16

## 2015-08-19 DIAGNOSIS — J0110 Acute frontal sinusitis, unspecified: Secondary | ICD-10-CM

## 2015-08-19 DIAGNOSIS — J45901 Unspecified asthma with (acute) exacerbation: Secondary | ICD-10-CM | POA: Insufficient documentation

## 2015-08-19 DIAGNOSIS — J309 Allergic rhinitis, unspecified: Secondary | ICD-10-CM | POA: Diagnosis not present

## 2015-08-19 DIAGNOSIS — J3089 Other allergic rhinitis: Secondary | ICD-10-CM

## 2015-08-19 DIAGNOSIS — J019 Acute sinusitis, unspecified: Secondary | ICD-10-CM | POA: Insufficient documentation

## 2015-08-19 MED ORDER — LEVOCETIRIZINE DIHYDROCHLORIDE 5 MG PO TABS: 5.0000 mg | ORAL_TABLET | Freq: Every evening | ORAL | Status: DC

## 2015-08-19 MED ORDER — MOMETASONE FURO-FORMOTEROL FUM 200-5 MCG/ACT IN AERO: 2.0000 | INHALATION_SPRAY | Freq: Two times a day (BID) | RESPIRATORY_TRACT | Status: DC

## 2015-08-19 MED ORDER — BECLOMETHASONE DIPROPIONATE 80 MCG/ACT NA AERS: 2.0000 | INHALATION_SPRAY | NASAL | Status: DC

## 2015-08-19 NOTE — Assessment & Plan Note (Signed)
   For now, continue appropriate allergen avoidance measures and aeroallergen immunotherapy as prescribed and as tolerated.  A prescription has been provided for levocetirizine, 5 mg daily as needed.  A sample and prescription have been provided for Qnasl 80 g, one actuation per nostril twice daily as needed.  Proper technique has been discussed and demonstrated.  I have also recommended nasal saline spray (i.e. Simply Saline) as needed prior to medicated nasal sprays.  To hasten symptom relief, prednisone has been provided (as above).

## 2015-08-19 NOTE — Assessment & Plan Note (Signed)
   Prednisone has been provided (as above).  A prescription has been provided for Qnasl (as above).  Nasal saline irrigation has been recommended.

## 2015-08-19 NOTE — Assessment & Plan Note (Signed)
Prednisone has been provided, 20 mg x 4 days, 10 mg x1 day, then stop.  A prescription has been provided for [facility] (mometasone/formoterol) 200/5 g, 2 inhalations via spacer device twice a day.  Continue montelukast 10 mg daily at bedtime and albuterol every 4-6 hours as needed.  The patient has been asked to contact me if her symptoms persist or progress. Otherwise, she may return for follow up in 4 months.

## 2015-08-19 NOTE — Progress Notes (Signed)
Follow-up Note  RE: Ariel Cervantes MRN: [id number] DOB: 1990-09-14 Date of Office Visit: 08/19/2015  Primary care provider: Raynelle JanSPRY,HEATHER M., MD Referring provider: Raynelle JanSpry, Heather M., MD  History of present illness: HPI Comments: Ariel Cervantes is a 25 y.o. female with allergic rhinoconjunctivitis and persistent asthma who presents today for sick visit.  She reports that in the interval since her initial visit in mid March she has experienced persistent rhinorrhea, nasal congestion, postnasal drainage, sore throat, and sinus headaches.  Recently she has also been experiencing coughing, chest tightness, dyspnea, and wheezing.  She states that she has been requiring albuterol rescue 2 or 3 times per day on average.  She has decided to start aeroallergen immunotherapy based upon the persistence of her symptoms despite compliance with multiple medications.  Has been taking Qvar 80 g, 2 inhalations via spacer twice a day, montelukast 10 mg daily bedtime, albuterol as needed, loratadine 10 mg daily, and fluticasone nasal spray as needed.  She was unable to tolerate azelastine nasal spray due to the taste of the medication.   Assessment and plan: Asthma with acute exacerbation Prednisone has been provided, 20 mg x 4 days, 10 mg x1 day, then stop.  A prescription has been [facility] for [facility] (mometasone/formoterol) 200/5 g, 2 inhalations via spacer device twice a day.  Continue montelukast 10 mg daily at bedtime and albuterol every 4-6 hours as needed.  The patient has been asked to contact me if her symptoms persist or progress. Otherwise, she may return for follow up in 4 months.  Perennial and seasonal allergic rhinitis  For now, continue appropriate allergen avoidance measures and aeroallergen immunotherapy as prescribed and as tolerated.  A prescription has been provided for levocetirizine, 5 mg daily as needed.  A sample and prescription have been provided for Qnasl 80 g, one  actuation per nostril twice daily as needed.  Proper technique has been discussed and demonstrated.  I have also recommended nasal saline spray (i.e. Simply Saline) as needed prior to medicated nasal sprays.  To hasten symptom relief, prednisone has been provided (as above).  Acute sinusitis  Prednisone has been provided (as above).  A prescription has been provided for Qnasl (as above).  Nasal saline irrigation has been recommended.    Meds ordered this encounter  Medications  . mometasone-formoterol (DULERA) 200-5 MCG/ACT AERO    Sig: Inhale 2 puffs into the lungs 2 (two) times daily.    Dispense:  1 Inhaler    Refill:  5  . levocetirizine (XYZAL) 5 MG tablet    Sig: Take 1 tablet (5 mg total) by mouth every evening.    Dispense:  30 tablet    Refill:  5  . Beclomethasone Dipropionate (QNASL) 80 MCG/ACT AERS    Sig: Place 2 sprays into both nostrils 1 day or 1 dose.    Dispense:  8.7 g    Refill:  5    Diagnositics: Spirometry reveals FVC of 2.00 L and FEV1 of 1.94 L (66% predicted) without post bronchodilator improvement.    Physical examination: Blood pressure 110/75, pulse 70, temperature 97.5 F (36.4 C), temperature source Oral, resp. rate 16.  General: Alert, interactive, in no acute distress. HEENT: TMs pearly gray, turbinates edematous with thick discharge, post-pharynx erythematous. Neck: Supple without lymphadenopathy. Lungs: Mildly decreased breath sounds bilaterally without wheezing, rhonchi or rales. CV: Normal S1, S2 without murmurs. Skin: Warm and dry, without lesions or rashes.  The following portions of the patient's history were  reviewed and updated as appropriate: allergies, current medications, past family history, past medical history, past social history, past surgical history and problem list.    Medication List       This list is accurate as of: 08/19/15  4:34 PM.  Always use your most recent med list.               albuterol 108 (90  Base) MCG/ACT inhaler  Commonly known as:  PROAIR HFA  Inhale 2 puffs into the lungs every 4 (four) hours as needed for wheezing or shortness of breath.     Azelastine HCl 0.15 % Soln  TWO SPRAYS EACH NOSTRIL TWICE A DAY FOR NASAL CONGESTION OR DRAINAGE.     beclomethasone 80 MCG/ACT inhaler  Commonly known as:  QVAR  Inhale 2 puffs into the lungs 2 (two) times daily.     Beclomethasone Dipropionate 80 MCG/ACT Aers  Commonly known as:  QNASL  Place 2 sprays into both nostrils 1 day or 1 dose.     benzonatate 200 MG capsule  Commonly known as:  TESSALON  Reported on 08/19/2015     cetirizine 10 MG tablet  Commonly known as:  ZYRTEC  Take 10 mg by mouth daily. Reported on 08/19/2015     desogestrel-ethinyl estradiol 0.15-30 MG-MCG tablet  Commonly known as:  APRI,EMOQUETTE,SOLIA  Reported on 08/19/2015     dicyclomine 10 MG capsule  Commonly known as:  BENTYL  Take 10 mg by mouth.     EPINEPHrine 0.3 mg/0.3 mL Soaj injection  Commonly known as:  EPIPEN 2-PAK  USE AS DIRECTED FOR SEVERE ALLERGIC REACTION     esomeprazole 40 MG capsule  Commonly known as:  NEXIUM  Take 40 mg by mouth.     fluticasone 50 MCG/ACT nasal spray  Commonly known as:  FLONASE  Place into the nose.     fluvoxaMINE 50 MG tablet  Commonly known as:  LUVOX  Take 50 mg by mouth.     levocetirizine 5 MG tablet  Commonly known as:  XYZAL  Take 1 tablet (5 mg total) by mouth every evening.     levonorgestrel-ethinyl estradiol 0.15-0.03 MG tablet  Commonly known as:  SEASONALE,INTROVALE,JOLESSA  Take by mouth.     loratadine 10 MG tablet  Commonly known as:  CLARITIN  Take 10 mg by mouth.     mometasone-formoterol 200-5 MCG/ACT Aero  Commonly known as:  DULERA  Inhale 2 puffs into the lungs 2 (two) times daily.     montelukast 10 MG tablet  Commonly known as:  SINGULAIR  Take 1 tablet (10 mg total) by mouth at bedtime.     traMADol 50 MG tablet  Commonly known as:  ULTRAM  Take 50 mg by  mouth. Reported on 08/19/2015     VYVANSE 30 MG capsule  Generic drug:  lisdexamfetamine  Take 30 mg by mouth.        Allergies  Allergen Reactions  . Oseltamivir Other (See Comments)    hives  . Sulfa Antibiotics Nausea And Vomiting  . Sulfamethoxazole Nausea And Vomiting   Review of systems: Constitutional: Negative for fever, chills and weight loss.  HENT: Negative for nosebleeds.   Positive for nasal congestion, rhinorrhea, sore throat, sinus headache. Eyes: Negative for blurred vision.  Respiratory: Negative for hemoptysis.   Positive for coughing, dyspnea, chest tightness, wheezing. Cardiovascular: Negative for chest pain.  Gastrointestinal: Negative for diarrhea and constipation.  Genitourinary: Negative for dysuria.  Musculoskeletal: Negative for myalgias  and joint pain.  Neurological: Negative for dizziness.  Endo/Heme/Allergies: Does not bruise/bleed easily.   Past Medical History  Diagnosis Date  . ADHD (attention deficit hyperactivity disorder)   . Anxiety   . Anorexia nervosa     Family History  Problem Relation Age of Onset  . Depression Father   . Anxiety disorder Father   . Allergic rhinitis Father   . Asthma Father   . Allergic rhinitis Mother   . Allergic rhinitis Brother     Social History   Social History  . Marital Status: Single    Spouse Name: N/A  . Number of Children: N/A  . Years of Education: N/A   Occupational History  . Not on file.   Social History Main Topics  . Smoking status: Never Smoker   . Smokeless tobacco: Never Used  . Alcohol Use: 2.0 oz/week    4 Standard drinks or equivalent per week  . Drug Use: No  . Sexual Activity:    Partners: Male    Birth Control/ Protection: Condom, OCP   Other Topics Concern  . Not on file   Social History Narrative    I appreciate the opportunity to take part in this Ryla's care. Please do not hesitate to contact me with questions.  Sincerely,   R. Jorene Guest,  MD

## 2015-08-19 NOTE — Patient Instructions (Addendum)
Asthma with acute exacerbation Prednisone has been provided, 20 mg x 4 days, 10 mg x1 day, then stop.  A [facility] has been [facility] ([facility]ol) 200/5 g, 2 inhalations via spacer device twice a day.  Continue montelukast 10 mg daily at bedtime and albuterol every 4-6 hours as needed.  The patient has been asked to contact me if her symptoms persist or progress. Otherwise, she may return for follow up in 4 months.  Perennial and seasonal allergic rhinitis  For now, continue appropriate allergen avoidance measures and aeroallergen immunotherapy as prescribed and as tolerated.  A prescription has been provided for levocetirizine, 5 mg daily as needed.  A sample and prescription have been provided for Qnasl 80 g, one actuation per nostril twice daily as needed.  Proper technique has been discussed and demonstrated.  I have also recommended nasal saline spray (i.e. Simply Saline) as needed prior to medicated nasal sprays.  To hasten symptom relief, prednisone has been provided (as above).  Acute sinusitis  Prednisone has been provided (as above).  A prescription has been provided for Qnasl (as above).  Nasal saline irrigation has been recommended.    Return in about 4 months (around 12/19/2015), or if symptoms worsen or fail to improve.

## 2015-08-22 ENCOUNTER — Ambulatory Visit (INDEPENDENT_AMBULATORY_CARE_PROVIDER_SITE_OTHER): Payer: Managed Care, Other (non HMO)

## 2015-08-22 DIAGNOSIS — J309 Allergic rhinitis, unspecified: Secondary | ICD-10-CM | POA: Diagnosis not present

## 2015-08-26 ENCOUNTER — Ambulatory Visit (INDEPENDENT_AMBULATORY_CARE_PROVIDER_SITE_OTHER): Payer: Managed Care, Other (non HMO)

## 2015-08-26 DIAGNOSIS — J309 Allergic rhinitis, unspecified: Secondary | ICD-10-CM | POA: Diagnosis not present

## 2015-08-29 ENCOUNTER — Ambulatory Visit (INDEPENDENT_AMBULATORY_CARE_PROVIDER_SITE_OTHER): Payer: Managed Care, Other (non HMO)

## 2015-08-29 DIAGNOSIS — J309 Allergic rhinitis, unspecified: Secondary | ICD-10-CM

## 2015-09-02 ENCOUNTER — Ambulatory Visit (INDEPENDENT_AMBULATORY_CARE_PROVIDER_SITE_OTHER): Payer: Managed Care, Other (non HMO)

## 2015-09-02 DIAGNOSIS — J309 Allergic rhinitis, unspecified: Secondary | ICD-10-CM | POA: Diagnosis not present

## 2015-09-16 ENCOUNTER — Ambulatory Visit (INDEPENDENT_AMBULATORY_CARE_PROVIDER_SITE_OTHER): Payer: Managed Care, Other (non HMO) | Admitting: *Deleted

## 2015-09-16 DIAGNOSIS — J309 Allergic rhinitis, unspecified: Secondary | ICD-10-CM

## 2015-09-19 ENCOUNTER — Ambulatory Visit (INDEPENDENT_AMBULATORY_CARE_PROVIDER_SITE_OTHER): Payer: Managed Care, Other (non HMO)

## 2015-09-19 DIAGNOSIS — J309 Allergic rhinitis, unspecified: Secondary | ICD-10-CM

## 2015-09-23 ENCOUNTER — Ambulatory Visit (INDEPENDENT_AMBULATORY_CARE_PROVIDER_SITE_OTHER): Payer: Managed Care, Other (non HMO)

## 2015-09-23 DIAGNOSIS — J309 Allergic rhinitis, unspecified: Secondary | ICD-10-CM

## 2015-09-26 ENCOUNTER — Ambulatory Visit (INDEPENDENT_AMBULATORY_CARE_PROVIDER_SITE_OTHER): Payer: Managed Care, Other (non HMO)

## 2015-09-26 DIAGNOSIS — J309 Allergic rhinitis, unspecified: Secondary | ICD-10-CM | POA: Diagnosis not present

## 2015-10-01 ENCOUNTER — Ambulatory Visit (INDEPENDENT_AMBULATORY_CARE_PROVIDER_SITE_OTHER): Payer: Managed Care, Other (non HMO)

## 2015-10-01 DIAGNOSIS — J309 Allergic rhinitis, unspecified: Secondary | ICD-10-CM

## 2015-10-04 ENCOUNTER — Ambulatory Visit (INDEPENDENT_AMBULATORY_CARE_PROVIDER_SITE_OTHER): Payer: Managed Care, Other (non HMO) | Admitting: *Deleted

## 2015-10-04 DIAGNOSIS — J309 Allergic rhinitis, unspecified: Secondary | ICD-10-CM

## 2015-10-07 ENCOUNTER — Ambulatory Visit (INDEPENDENT_AMBULATORY_CARE_PROVIDER_SITE_OTHER): Payer: Managed Care, Other (non HMO)

## 2015-10-07 DIAGNOSIS — J309 Allergic rhinitis, unspecified: Secondary | ICD-10-CM

## 2015-10-14 ENCOUNTER — Ambulatory Visit (INDEPENDENT_AMBULATORY_CARE_PROVIDER_SITE_OTHER): Payer: Managed Care, Other (non HMO)

## 2015-10-14 DIAGNOSIS — J309 Allergic rhinitis, unspecified: Secondary | ICD-10-CM | POA: Diagnosis not present

## 2015-11-07 ENCOUNTER — Encounter: Payer: Self-pay | Admitting: Allergy and Immunology

## 2015-11-07 ENCOUNTER — Ambulatory Visit (INDEPENDENT_AMBULATORY_CARE_PROVIDER_SITE_OTHER): Payer: Managed Care, Other (non HMO) | Admitting: Allergy and Immunology

## 2015-11-07 VITALS — BP 110/64 | HR 106 | Temp 98.6°F | Resp 16

## 2015-11-07 DIAGNOSIS — J4540 Moderate persistent asthma, uncomplicated: Secondary | ICD-10-CM | POA: Diagnosis not present

## 2015-11-07 DIAGNOSIS — J309 Allergic rhinitis, unspecified: Secondary | ICD-10-CM

## 2015-11-07 DIAGNOSIS — H1010 Acute atopic conjunctivitis, unspecified eye: Secondary | ICD-10-CM

## 2015-11-07 MED ORDER — OLOPATADINE HCL 0.6 % NA SOLN: NASAL | Status: DC

## 2015-11-07 MED ORDER — MONTELUKAST SODIUM 10 MG PO TABS: ORAL_TABLET | ORAL | Status: DC

## 2015-11-07 MED ORDER — BUDESONIDE-FORMOTEROL FUMARATE 160-4.5 MCG/ACT IN AERO: 2.0000 | INHALATION_SPRAY | Freq: Two times a day (BID) | RESPIRATORY_TRACT | Status: DC

## 2015-11-07 NOTE — Patient Instructions (Addendum)
    Use Symbicort 160mcg 2 puffs each am and pm.  (Hold QVAR for now).  Saline nasal wash twice daily.  Complete Augmentin as prescribed.  Restart Singulair 10mg  each evening.  Use Rhinocort 1-2 sprays each nostril each morning.  If persisting symptoms add Patanase 1-2 sprays each nostril each evening.  Prednisone 30mg  today.  Follow-up with Dr. Nunzio CobbsBobbitt as scheduled.

## 2015-11-07 NOTE — Progress Notes (Signed)
FOLLOW UP NOTE  RE: Ariel Cervantes MRN: [id number] DOB: 1991-03-07 [facility] [place] [address] [place] [id number] Date of Office Visit: 11/07/2015  Subjective:  Ariel Cervantes is a 25 y.o. female who presents today for Acute Visit  Assessment:   1. Moderate persistent asthma, recent exacerbation, status post prednisone, improving in no respiratory distress.  2. Allergic rhinoconjunctivitis.   3.      Recent respiratory illness, completing Augmentin. 4.      Recent residence move and now daily cat exposure. 5.      Maintenance medications (Dulera and Qnasl) not on formulary. Plan:   Meds ordered this encounter  Medications  . budesonide-formoterol (SYMBICORT) 160-4.5 MCG/ACT inhaler    Sig: Inhale 2 puffs into the lungs 2 (two) times daily.    Dispense:  1 Inhaler    Refill:  3  . montelukast (SINGULAIR) 10 MG tablet    Sig: Take one tablet each evening to prevent cough or wheeze.    Dispense:  30 tablet    Refill:  5  . Olopatadine HCl 0.6 % SOLN    Sig: Use 1-2 sprays in each nostril once daily in the evening for congestion.    Dispense:  1 Bottle    Refill:  3  1.  Use Symbicort 160mcg 2 puffs each am and pm.  (Hold QVAR for now). 2.  Saline nasal wash twice daily. 3.  Complete Augmentin as prescribed. 4.  Restart Singulair 10mg  each evening. 5.  Use Rhinocort 1-2 sprays each nostril each morning. 6.  If persisting symptoms add Patanase 1-2 sprays each nostril each evening. 7.  Prednisone 30mg  today 8.  Follow-up with Dr. Nunzio CobbsBobbitt as scheduled and review possible new environment for cat, as Ariel Cervantes was not able to continue.  HPI: Ariel Cervantes returns to the office with recent rhinorrhea, congestion, postnasal drip, cough and wheeze.  She had been doing well since her last visit with Dr. Nunzio CobbsBobbitt in April--having completed prednisone without difficulty.  But had returned to using Qvar because [facility]era was not covered by her insurance.  And  has maintained on Singulair, Xyzal, with Azelastine and recently using [employer] a few times a day.  In the last week, her symptoms became more prominent with chest congestion, increased wheezing and intermittent shortness of breath with occasional nocturnal cough.  She saw her primary M.D. who initiated a course of prednisone, now completed 6 days, and now day 6 of 10 on Augmentin.  She recently has changed residence living with her fianc and a roommate who has a cat.  And decided not to continue with immunotherapy given the expense.  Denies ED or urgent care visits.  Reports activity is normal .  Ariel Cervantes has a current medication list which includes the following prescription(s): albuterol, amoxicillin-clavulanate, azelastine hcl, beclomethasone, desogestrel-ethinyl estradiol, dicyclomine, epinephrine, esomeprazole, fluvoxamine, levocetirizine, levonorgestrel-ethinyl estradiol, lisdexamfetamine, loratadine, montelukast, tramadol.   Drug Allergies: Allergies  Allergen Reactions  . Oseltamivir Other (See Comments)    hives  . Sulfa Antibiotics Nausea And Vomiting  . Sulfamethoxazole Nausea And Vomiting   Objective:   Filed Vitals:   11/07/15 1001  BP: 110/64  Pulse: 106  Temp: 98.6 F (37 C)  Resp: 16   SpO2 Readings from Last 1 Encounters:  11/07/15 97%   Physical Exam  Constitutional: She is well-developed, well-nourished, and in no distress.  HENT:  Head: Atraumatic.  Right Ear: Tympanic membrane and ear canal normal.  Left  Ear: Tympanic membrane and ear canal normal.  Nose: Mucosal edema present. No rhinorrhea. No epistaxis.  Mouth/Throat: Oropharynx is clear and moist and mucous membranes are normal. No oropharyngeal exudate, posterior oropharyngeal edema or posterior oropharyngeal erythema.  Neck: Neck supple.  Cardiovascular: Normal rate, S1 normal and S2 normal.   No murmur heard. Pulmonary/Chest: Effort normal. She has no wheezes. She has no rhonchi. She has no rales.  Post  Xopenex/Atrovent neb: Continues to be clear.  Patient without wheeze, rhonchi or crackles.  Lymphadenopathy:    She has no cervical adenopathy.   Diagnostics: Spirometry:  FVC  3.48--109%, FEV1 3.25--115%; essentially no change postbronchodilator.    Roselyn M. Willa RoughHicks, MD  cc: Raynelle JanSPRY,HEATHER M., MD

## 2015-12-05 ENCOUNTER — Other Ambulatory Visit: Payer: Self-pay

## 2015-12-05 MED ORDER — MONTELUKAST SODIUM 10 MG PO TABS: ORAL_TABLET | ORAL | 2 refills | Status: DC

## 2015-12-23 ENCOUNTER — Ambulatory Visit: Payer: Managed Care, Other (non HMO) | Admitting: Allergy

## 2015-12-23 ENCOUNTER — Ambulatory Visit: Payer: Managed Care, Other (non HMO) | Admitting: Allergy and Immunology

## 2016-01-20 ENCOUNTER — Ambulatory Visit: Payer: Managed Care, Other (non HMO) | Admitting: Allergy

## 2016-02-12 ENCOUNTER — Encounter: Payer: Self-pay | Admitting: Allergy

## 2016-02-12 ENCOUNTER — Ambulatory Visit (INDEPENDENT_AMBULATORY_CARE_PROVIDER_SITE_OTHER): Payer: Managed Care, Other (non HMO) | Admitting: Allergy

## 2016-02-12 VITALS — BP 130/78 | HR 88 | Resp 22

## 2016-02-12 DIAGNOSIS — J3089 Other allergic rhinitis: Secondary | ICD-10-CM

## 2016-02-12 DIAGNOSIS — L2089 Other atopic dermatitis: Secondary | ICD-10-CM | POA: Diagnosis not present

## 2016-02-12 DIAGNOSIS — J4540 Moderate persistent asthma, uncomplicated: Secondary | ICD-10-CM | POA: Diagnosis not present

## 2016-02-12 NOTE — Patient Instructions (Signed)
Use Symbicort 160mcg 2 puffs each am and pm.   Albuterol as needed  Saline nasal wash once or twice daily.  Continue Singulair 10mg  each evening.  Use Rhinocort 2 sprays each nostril twice a day for next week or two  Uses Patanase 2 sprays each nostril twice a day  Uses triamcinolone on affected areas twice a day (may use every other day).  Uses eucrisa on the other days    Follow-up 6 months or sooner if needed

## 2016-02-12 NOTE — Progress Notes (Signed)
Follow-up Note  RE: Ariel Cervantes MRN: [id number] DOB: 02/23/1991 Date of Office Visit: 02/12/2016   History of present illness: Ariel Cervantes is a 25 y.o. female presenting today for follow-up of asthma, allergies, eczema. He was last seen in our office in July 2017 by Dr. Willa Rough.  At that time she was recovering from an asthma exacerbation and URI requiring an Augmentin course. She was change from Qvar to Symbicort.  From an asthma she has done well with this change. She also got rid of the cat that was living in her home and has noted a big difference with that change as well.  However recently with the changes in the weather she has noted more cough and wheeze and has used albuterol 1-2 times on average a week.  She also has noted over the past week that her nasal symptoms have returned with more nasal congestion and mild drainage. She feels a bit of frontal sinus pressure and right maxillary cheek pressure. She has been using Xyzal, Singulair as well as olopatadine nasal spray 2 sprays twice a day. She also has rhinocort and astelin sprays at home that is not currently using.  She stopped using her Rhinocort about a week or so ago prior to return of her nasal symptoms.  She denies any fevers or sick contacts that she is aware of.     She also is struggling a bit with her eczema on her hands and nape of neck.  She is using triamcinolone every other day to the area. She moisturizes with [place] eczema.     Review of systems: Review of Systems  Constitutional: Negative for chills and fever.  HENT: Positive for congestion and sore throat.   Eyes: Negative for redness.  Respiratory: Positive for cough and wheezing.   Cardiovascular: Negative for chest pain.  Gastrointestinal: Negative for nausea and vomiting.  Skin: Positive for itching and rash.  Neurological: Positive for headaches.    All other systems negative unless noted above in HPI  Past medical/social/surgical/family  history have been reviewed and are unchanged unless specifically indicated below.  No changes  Medication List:   Medication List       Accurate as of 02/12/16  4:42 PM. Always use your most recent med list.          albuterol 108 (90 Base) MCG/ACT inhaler Commonly known as:  PROAIR HFA Inhale 2 puffs into the lungs every 4 (four) hours as needed for wheezing or shortness of breath.   amoxicillin-clavulanate 250-125 MG tablet Commonly known as:  AUGMENTIN Take 1 tablet by mouth 3 (three) times daily.   Azelastine HCl 0.15 % Soln TWO SPRAYS EACH NOSTRIL TWICE A DAY FOR NASAL CONGESTION OR DRAINAGE.   beclomethasone 80 MCG/ACT inhaler Commonly known as:  QVAR Inhale 2 puffs into the lungs 2 (two) times daily.   Beclomethasone Dipropionate 80 MCG/ACT Aers Commonly known as:  QNASL Place 2 sprays into both nostrils 1 day or 1 dose.   budesonide-formoterol 160-4.5 MCG/ACT inhaler Commonly known as:  SYMBICORT Inhale 2 puffs into the lungs 2 (two) times daily.   desogestrel-ethinyl estradiol 0.15-30 MG-MCG tablet Commonly known as:  APRI,EMOQUETTE,SOLIA Reported on 08/19/2015   dicyclomine 10 MG capsule Commonly known as:  BENTYL Take 10 mg by mouth.   EPINEPHrine 0.3 mg/0.3 mL Soaj injection Commonly known as:  EPIPEN 2-PAK USE AS DIRECTED FOR SEVERE ALLERGIC REACTION   esomeprazole 40 MG capsule Commonly known as:  NEXIUM  Take 40 mg by mouth.   fluvoxaMINE 50 MG tablet Commonly known as:  LUVOX Take 50 mg by mouth.   levocetirizine 5 MG tablet Commonly known as:  XYZAL Take 1 tablet (5 mg total) by mouth every evening.   levonorgestrel-ethinyl estradiol 0.15-0.03 MG tablet Commonly known as:  SEASONALE,INTROVALE,JOLESSA Take by mouth.   loratadine 10 MG tablet Commonly known as:  CLARITIN Take 10 mg by mouth.   montelukast 10 MG tablet Commonly known as:  SINGULAIR Take one tablet each evening to prevent cough or wheeze.   Olopatadine HCl 0.6 %  Soln Use 1-2 sprays in each nostril once daily in the evening for congestion.   predniSONE 20 MG tablet Commonly known as:  DELTASONE Reported on 11/11/2015   traMADol 50 MG tablet Commonly known as:  ULTRAM Take 50 mg by mouth. Reported on 08/19/2015   VYVANSE 30 MG capsule Generic drug:  lisdexamfetamine Take 30 mg by mouth.       Known medication allergies: Allergies  Allergen Reactions  . Oseltamivir Other (See Comments)    hives  . Sulfa Antibiotics Nausea And Vomiting  . Sulfamethoxazole Nausea And Vomiting     Physical examination: Blood pressure 130/78, pulse 88, resp. rate (!) 22.  General: Alert, interactive, in no acute distress. HEENT: TMs pearly gray, turbinates moderately edematous with clear discharge, post-pharynx non erythematous. Neck: Supple without lymphadenopathy. Lungs: Clear to auscultation without wheezing, rhonchi or rales. {no increased work of breathing. CV: Normal S1, S2 without murmurs. Abdomen: Nondistended, nontender. Skin: Dry, erythematous, excoriated patches on the Palmar surface of left hand as well as around the finger pads of time and index finger; also at the nape of her neck. Extremities:  No clubbing, cyanosis or edema. Neuro:   Grossly intact.  Diagnositics/Labs:  Spirometry: FEV1: 3.16L  112%, FVC: 3.4L  107%, ratio consistent with Nonobstructive pattern  Assessment and plan:   Moderate persistent asthma  - Continue Symbicort 160mcg 2 puffs each am and pm.     Albuterol as needed   Singulair 10mg  each evening.  Asthma control goals:   Full participation in all desired activities (may need albuterol before activity)  Albuterol use two time or less a week on average (not counting use with activity)  Cough interfering with sleep two time or less a month  Oral steroids no more than once a year  No hospitalizations  Allergic rhinoconjunctivitis - Saline nasal wash once or twice daily followed by nasal sprays. - Use  Rhinocort 2 sprays each nostril twice a day for next week or two - Use Patanase 2 sprays each nostril twice a day - She will let us know if symptoms do not improve over the next week.   Atopic dermatitis - triamcinolone on affected areas twice a day (may use every other day).   - Use eucrisa on the other days, provided with samples - Encourage continue daily moisturization  Follow-up 6 months or sooner if needed   I appreciate the opportunity to take part in Caliegh's care. Please do not hesitate to contact me with questions.  Sincerely,   Margo AyeShaylar Nikolaus Pienta, MD Allergy/Immunology [facility]

## 2016-02-19 ENCOUNTER — Telehealth: Payer: Self-pay

## 2016-02-19 NOTE — Telephone Encounter (Signed)
Please advise. Patient states if you call out an antibiotic she will need a Diflucan as well.

## 2016-02-19 NOTE — Telephone Encounter (Signed)
Patient was seen 02/12/16 By Dr. Delorse LekPadgett. Patient stated that if the nasal spray didn't work and things got worse that she was instructed to give us a call and we can send in something else.  Please Advise  Thanks      [facility] [address]

## 2016-02-19 NOTE — Telephone Encounter (Signed)
Informed patient that Dr. Delorse LekPadgett will not be in the office until Thursday 10/18 and patient stated that was fine.

## 2016-02-20 MED ORDER — FLUCONAZOLE 150 MG PO TABS: 150.0000 mg | ORAL_TABLET | Freq: Every day | ORAL | 0 refills | Status: DC

## 2016-02-20 MED ORDER — AZITHROMYCIN 250 MG PO TABS: ORAL_TABLET | ORAL | 0 refills | Status: DC

## 2016-02-20 NOTE — Telephone Encounter (Signed)
Ordered z-pak and diflucan to pharmacy on file.  Please let pt know.

## 2016-02-20 NOTE — Telephone Encounter (Signed)
Patient called asking if Dr. Delorse LekPadgett had seen her message. I informed her that prescriptions for a Z-Pack and Diflucan were sent to her pharmacy that she has on file with us.

## 2016-02-25 ENCOUNTER — Telehealth: Payer: Self-pay | Admitting: Allergy

## 2016-02-25 MED ORDER — PREDNISONE 20 MG PO TABS: 40.0000 mg | ORAL_TABLET | Freq: Every day | ORAL | 0 refills | Status: AC

## 2016-02-25 MED ORDER — FLUCONAZOLE 150 MG PO TABS
150.0000 mg | ORAL_TABLET | Freq: Every day | ORAL | 2 refills | Status: DC
Start: 2016-02-25 — End: 2016-11-04

## 2016-02-25 MED ORDER — AMOXICILLIN-POT CLAVULANATE 875-125 MG PO TABS: 1.0000 | ORAL_TABLET | Freq: Two times a day (BID) | ORAL | 0 refills | Status: AC

## 2016-02-25 NOTE — Telephone Encounter (Signed)
Please advise 

## 2016-02-25 NOTE — Telephone Encounter (Signed)
Patient called and said she was seen on 10/11 by Dr. Delorse LekPadgett. She called in a Z-pack for her. When Rachael called today, she said she is still sick and needs something else.

## 2016-02-25 NOTE — Telephone Encounter (Signed)
Informed patient of additional antibiotic and prednisone and to follow up with how she is feeling at the end of the Augmentin.

## 2016-02-25 NOTE — Telephone Encounter (Signed)
If she is no better following z-pak then need to extend and broaden coverage.  Please send in augmentin 875mg  BID x 10 days and have her do prednisone 40mg  x 3 days.   Diflucan 150mg  x 1 tab to take at end of course to prevention yeast infection

## 2016-04-07 ENCOUNTER — Other Ambulatory Visit: Payer: Self-pay | Admitting: Allergy

## 2016-04-07 MED ORDER — BUDESONIDE-FORMOTEROL FUMARATE 160-4.5 MCG/ACT IN AERO: 2.0000 | INHALATION_SPRAY | Freq: Two times a day (BID) | RESPIRATORY_TRACT | 3 refills | Status: DC

## 2016-08-17 ENCOUNTER — Ambulatory Visit (INDEPENDENT_AMBULATORY_CARE_PROVIDER_SITE_OTHER): Payer: Managed Care, Other (non HMO) | Admitting: Allergy

## 2016-08-17 ENCOUNTER — Encounter: Payer: Self-pay | Admitting: Allergy

## 2016-08-17 VITALS — BP 106/68 | HR 90 | Temp 99.1°F | Resp 20 | Ht 61.0 in | Wt 138.4 lb

## 2016-08-17 DIAGNOSIS — J0100 Acute maxillary sinusitis, unspecified: Secondary | ICD-10-CM

## 2016-08-17 DIAGNOSIS — J3089 Other allergic rhinitis: Secondary | ICD-10-CM | POA: Diagnosis not present

## 2016-08-17 DIAGNOSIS — L2089 Other atopic dermatitis: Secondary | ICD-10-CM

## 2016-08-17 DIAGNOSIS — J4540 Moderate persistent asthma, uncomplicated: Secondary | ICD-10-CM | POA: Diagnosis not present

## 2016-08-17 MED ORDER — AMOXICILLIN-POT CLAVULANATE 875-125 MG PO TABS: 1.0000 | ORAL_TABLET | Freq: Two times a day (BID) | ORAL | 0 refills | Status: AC

## 2016-08-17 MED ORDER — MOMETASONE FUROATE 0.1 % EX CREA: 1.0000 "application " | TOPICAL_CREAM | Freq: Every day | CUTANEOUS | 0 refills | Status: DC

## 2016-08-17 NOTE — Progress Notes (Signed)
Follow-up Note  RE: Ariel Cervantes MRN: [id number] DOB: [birth year] Date of Office Visit: 08/17/2016   History of present illness: Ariel Cervantes is a 27 y.o. female presenting today for follow-up of asthma, allergic rhinoconjunctivits and atopic dermatitis.  Since last visit she did get the flu in December treated as an outpatient.  In March she reports she developed a sinus infection and was treated with Amoxicillin  10days but did not feel she was completely better with this regimen.  Since that time she has continued to have sinus pain and pressure and headaches as well as increased nasal congestion and drainage.   She reports some occasional sweats but no fever.   She was doing saline rinses but has not been doing them regularly lately.  She does take Flonase 2 sprays each nostril daily and xyzal.   She has needed to use her albuterol more frequently with pollen season.  She is using albuterol 2-3x a week.  She continues on symbicort and singulair daily.   Her hand eczema is still an issue and has been peeling more lately.  she did not find much benefit from use of Eucrisa so she stopped.  She is currently just using triamcinolone.  She has used protopic as well in the past.  She did get a kenalog injection several months ago for her hand eczema.   She is getting married in June.      Review of systems: Review of Systems  Constitutional: Positive for diaphoresis. Negative for chills, fever and malaise/fatigue.  HENT: Positive for congestion and sinus pain. Negative for ear discharge, ear pain, nosebleeds, sore throat and tinnitus.   Eyes: Negative for discharge and redness.  Respiratory: Positive for cough and wheezing. Negative for shortness of breath.   Cardiovascular: Negative for chest pain.  Gastrointestinal: Negative for abdominal pain, heartburn, nausea and vomiting.  Musculoskeletal: Negative for joint pain and myalgias.  Skin: Positive for itching and rash.  Neurological:  Positive for headaches. Negative for dizziness.    All other systems negative unless noted above in HPI  Past medical/social/surgical/family history have been reviewed and are unchanged unless specifically indicated below.  No changes  Medication List: Allergies as of 08/17/2016      Reactions   Oseltamivir Other (See Comments)   hives   Sulfa Antibiotics Nausea And Vomiting   Sulfamethoxazole Nausea And Vomiting      Medication List       Accurate as of 08/17/16  5:53 PM. Always use your most recent med list.          albuterol 108 (90 Base) MCG/ACT inhaler Commonly known as:  PROAIR HFA Inhale 2 puffs into the lungs every 4 (four) hours as needed for wheezing or shortness of breath.   Azelastine HCl 0.15 % Soln TWO SPRAYS EACH NOSTRIL TWICE A DAY FOR NASAL CONGESTION OR DRAINAGE.   azithromycin 250 MG tablet Commonly known as:  ZITHROMAX Z-PAK Take  (2 tabs) on day 1 then  (1 tab) for 4 days   beclomethasone 80 MCG/ACT inhaler Commonly known as:  QVAR Inhale 2 puffs into the lungs 2 (two) times daily.   Beclomethasone Dipropionate 80 MCG/ACT Aers Commonly known as:  QNASL Place 2 sprays into both nostrils 1 day or 1 dose.   budesonide-formoterol 160-4.5 MCG/ACT inhaler Commonly known as:  SYMBICORT Inhale 2 puffs into the lungs 2 (two) times daily.   desogestrel-ethinyl estradiol 0.15-30 MG-MCG tablet Commonly known as:  APRI,EMOQUETTE,SOLIA Reported on  08/19/2015   dicyclomine 10 MG capsule Commonly known as:  BENTYL Take 10 mg by mouth.   EPINEPHrine 0.3 mg/0.3 mL Soaj injection Commonly known as:  EPIPEN 2-PAK USE AS DIRECTED FOR SEVERE ALLERGIC REACTION   esomeprazole 40 MG capsule Commonly known as:  NEXIUM Take 40 mg by mouth.   fluconazole 150 MG tablet Commonly known as:  DIFLUCAN Take 1 tablet (150 mg total) by mouth daily. At start and end of antibiotic course   fluticasone 50 MCG/ACT nasal spray Commonly known as:   FLONASE Place into the nose.   fluvoxaMINE 50 MG tablet Commonly known as:  LUVOX Take 50 mg by mouth.   levocetirizine 5 MG tablet Commonly known as:  XYZAL Take 1 tablet (5 mg total) by mouth every evening.   levonorgestrel-ethinyl estradiol 0.15-0.03 MG tablet Commonly known as:  SEASONALE,INTROVALE,JOLESSA Take by mouth.   loratadine 10 MG tablet Commonly known as:  CLARITIN Take 10 mg by mouth.   montelukast 10 MG tablet Commonly known as:  SINGULAIR Take one tablet each evening to prevent cough or wheeze.   Olopatadine HCl 0.6 % Soln Use 1-2 sprays in each nostril once daily in the evening for congestion.   traMADol 50 MG tablet Commonly known as:  ULTRAM Take 50 mg by mouth. Reported on 08/19/2015   traZODone 50 MG tablet Commonly known as:  DESYREL   VYVANSE 30 MG capsule Generic drug:  lisdexamfetamine Take 30 mg by mouth.       Known medication allergies: Allergies  Allergen Reactions  . Oseltamivir Other (See Comments)    hives  . Sulfa Antibiotics Nausea And Vomiting  . Sulfamethoxazole Nausea And Vomiting     Physical examination: Blood pressure 106/68, pulse 90, temperature 99.1 F (37.3 C), temperature source Oral, resp. rate 20, height  (1.549 m), weight 138 lb 6.4 oz (62.8 kg), SpO2 97 %.  General: Alert, interactive, in no acute distress. HEENT: TMs pearly gray, turbinates moderately edematous with clear discharge, post-pharynx non erythematous. TTP over max sinus and frontal sinus Neck: Supple without lymphadenopathy. Lungs: Clear to auscultation without wheezing, rhonchi or rales. {no increased work of breathing. CV: Normal S1, S2 without murmurs. Abdomen: Nondistended, nontender. Skin: Dry, erythematous, excoriated patches on the palms and fingers b/l  r>L. Extremities:  No clubbing, cyanosis or edema. Neuro:   Grossly intact.  Diagnositics/Labs: Spirometry: FEV1: 3.18L  109%, FVC: 3.39L  102%, ratio consistent with  nonobstructive pattern  Assessment and plan:   Sinusitis, maxillary with incomplete resolution s/p 1 round of antibiotics - Saline nasal wash once or twice daily. - Take Augmentin  twice a day x 10 days - Continue Flonase 2 sprays each nostril twice a day  Mod persistent asthma  - Continue Symbicort 2 puffs each am and pm.   - Continue Singulair  each evening.  - Albuterol as needed  Allergic rhinitis - continue Xyzal, singulair, flonase, saline rinse as above  Atopic dermatitis of hands - difficult to treat - will step-up to Elocon to replace triamcinolone -  Daily moisturization to hands.   Recommend using cotton gloves at night to keep steroid cream and moisturization in place. - she has tried and failed on [place] and protopic - believe she will benefit from Dupixent.  Benefits and risks discussed today and information handout provided.  Will start approval process.    Follow-up 6 months or sooner if needed   I appreciate the opportunity to take part in Ariel Cervantes's care. Please  do not hesitate to contact me with questions.  Sincerely,   Prudy Feeler, MD Allergy/Immunology [facility]

## 2016-08-17 NOTE — Patient Instructions (Addendum)
Continue Symbicort 2 puffs each am and pm.   Albuterol as needed  Saline nasal wash once or twice daily.  Take Augmentin  twice a day x 10 days  Continue Singulair  each evening.  Continue Flonase 2 sprays each nostril twice a day  Try use of Elocon to eczema on hands.   This replaces triamcinolone.     Daily moisturization to hands.   Recommend using cotton gloves at night to keep steroid cream and moisturization in place.   Will start approval process for Dupixent injectable treatment option for eczema.    Follow-up 6 months or sooner if needed

## 2016-08-31 ENCOUNTER — Other Ambulatory Visit: Payer: Self-pay | Admitting: Allergy

## 2016-08-31 ENCOUNTER — Other Ambulatory Visit: Payer: Self-pay | Admitting: Allergy & Immunology

## 2016-08-31 ENCOUNTER — Ambulatory Visit (INDEPENDENT_AMBULATORY_CARE_PROVIDER_SITE_OTHER): Payer: Managed Care, Other (non HMO) | Admitting: *Deleted

## 2016-08-31 DIAGNOSIS — L209 Atopic dermatitis, unspecified: Secondary | ICD-10-CM | POA: Diagnosis not present

## 2016-08-31 MED ORDER — BUDESONIDE-FORMOTEROL FUMARATE 160-4.5 MCG/ACT IN AERO: 2.0000 | INHALATION_SPRAY | Freq: Two times a day (BID) | RESPIRATORY_TRACT | 3 refills | Status: DC

## 2016-08-31 NOTE — Progress Notes (Signed)
Immunotherapy   Patient Details  Name: Ariel Cervantes MRN: [id number] Date of Birth: 10/26/1990  08/31/2016  [name] started injections for  Dupixent 600 mg today then 300 mg every 14 days.  Epi-Pen:Epi-Pen Available  Consent signed and patient instructions given. Gave patient first injection the showed fiance how to give second injection. After second injection patient started to feel light headed and anxiety due to the injections had patient sit in chair and patient proceeded to pass out. Was able to wake patient quickly and went for help while fiance stayed with patient. Beth came in shot room and took vitals. Vitals @ 3:03 pm P-44, R-16, O2 - 98%, 3:08 pm - P-60, R-16, BP - 90/60, water and coke given to patient, 3:12 pm P-62, R-18, O2 - 97%, 3:16 pm P-66, R -18, BP-100/60, O2-97 %, patient was feeling much better at this point was able to sit in chair without help. Observed patient in office for an additional 40 minutes. Last set of vitals @ 3:40 pm P-66. R-16, BP-98/60, O2-98%. Patient will come to office for next injection.    Vella Redhead 08/31/2016, 5:00 PM

## 2016-09-14 ENCOUNTER — Ambulatory Visit (INDEPENDENT_AMBULATORY_CARE_PROVIDER_SITE_OTHER): Payer: Managed Care, Other (non HMO)

## 2016-09-14 DIAGNOSIS — L209 Atopic dermatitis, unspecified: Secondary | ICD-10-CM

## 2016-09-14 MED ORDER — EPINEPHRINE 0.3 MG/0.3ML IJ SOAJ: 0.3000 mg | Freq: Once | INTRAMUSCULAR | 2 refills | Status: DC

## 2016-09-14 MED ORDER — EPINEPHRINE 0.3 MG/0.3ML IJ SOAJ: 0.3000 mg | Freq: Once | INTRAMUSCULAR | 2 refills | Status: AC

## 2016-09-14 NOTE — Progress Notes (Signed)
Immunotherapy   Patient Details  Name: Gwinda MaineRachel A Horton MRN: [id number] Date of Birth: [birth year]  09/14/2016  Fleet Contrasachel A Horton received a Dupixent 300mg  injection in office today.  Lot: [id number] Exp: 08/2017 Epi-Pen:Prescription for Epi-Pen given  Pt came in today for her second Injection of Dupixent. I demonstrated a sub-q injection on her RUA to her fiance-Corey. Denyse AmassCorey verbalized understanding. The pt is to take home the other syringe to give in two weeks. Also sent in the Auvi-Q epi-pen. Pt states hers may be expired.   Joeanna Howdyshell 09/14/2016, 3:12 PM

## 2016-11-04 ENCOUNTER — Emergency Department (HOSPITAL_COMMUNITY)
Admission: EM | Admit: 2016-11-04 | Discharge: 2016-11-04 | Disposition: A | Discharge status: Home / Self Care | Payer: Managed Care, Other (non HMO) | Attending: Emergency Medicine | Admitting: Emergency Medicine

## 2016-11-04 ENCOUNTER — Encounter (HOSPITAL_COMMUNITY): Payer: Self-pay | Admitting: Emergency Medicine

## 2016-11-04 ENCOUNTER — Emergency Department (HOSPITAL_COMMUNITY): Payer: Managed Care, Other (non HMO)

## 2016-11-04 DIAGNOSIS — R0989 Other specified symptoms and signs involving the circulatory and respiratory systems: Secondary | ICD-10-CM | POA: Diagnosis not present

## 2016-11-04 DIAGNOSIS — J029 Acute pharyngitis, unspecified: Secondary | ICD-10-CM | POA: Diagnosis present

## 2016-11-04 DIAGNOSIS — Z79899 Other long term (current) drug therapy: Secondary | ICD-10-CM | POA: Insufficient documentation

## 2016-11-04 DIAGNOSIS — K2900 Acute gastritis without bleeding: Secondary | ICD-10-CM | POA: Insufficient documentation

## 2016-11-04 DIAGNOSIS — J45909 Unspecified asthma, uncomplicated: Secondary | ICD-10-CM | POA: Insufficient documentation

## 2016-11-04 DIAGNOSIS — F909 Attention-deficit hyperactivity disorder, unspecified type: Secondary | ICD-10-CM | POA: Insufficient documentation

## 2016-11-04 DIAGNOSIS — F419 Anxiety disorder, unspecified: Secondary | ICD-10-CM | POA: Diagnosis not present

## 2016-11-04 LAB — I-STAT BETA HCG BLOOD, ED (MC, WL, AP ONLY): I-stat hCG, quantitative: <5 m[IU]/mL (ref <5)

## 2016-11-04 MED ORDER — SUCRALFATE 1 GM/10ML PO SUSP: 1.0000 g | Freq: Three times a day (TID) | ORAL | 0 refills | Status: DC

## 2016-11-04 MED ORDER — BENZOCAINE 20 % MT AERO
INHALATION_SPRAY | Freq: Once | OROMUCOSAL | Status: DC
  Filled 2016-11-04: qty 57

## 2016-11-04 MED ORDER — ONDANSETRON 4 MG PO TBDP
4.0000 mg | ORAL_TABLET | Freq: Once | ORAL | Status: AC
  Administered 2016-11-04: 4 mg via ORAL
  Filled 2016-11-04: qty 1

## 2016-11-04 MED ORDER — DEXAMETHASONE 4 MG PO TABS
10.0000 mg | ORAL_TABLET | Freq: Once | ORAL | Status: AC
  Administered 2016-11-04: 10 mg via ORAL
  Filled 2016-11-04: qty 3

## 2016-11-04 MED ORDER — ONDANSETRON 4 MG PO TBDP: 4.0000 mg | ORAL_TABLET | Freq: Three times a day (TID) | ORAL | 0 refills | Status: DC | PRN

## 2016-11-04 MED ORDER — LIDOCAINE HCL 2 % EX GEL: 1.0000 "application " | Freq: Once | CUTANEOUS | Status: DC

## 2016-11-04 MED ORDER — GI COCKTAIL ~~LOC~~
30.0000 mL | Freq: Once | ORAL | Status: AC
  Administered 2016-11-04: 30 mL via ORAL
  Filled 2016-11-04: qty 30

## 2016-11-04 NOTE — ED Notes (Signed)
The pts medication  Delayed until after the exam  At the doctors request

## 2016-11-04 NOTE — ED Notes (Signed)
THE PT HAS RETURNED FROM XRAY  C/O A RETURNING ABD PAIN  WAITING FOR THE RESULTS OF THE  XRAY

## 2016-11-04 NOTE — ED Triage Notes (Signed)
Pt sts feels like food bolus last night after eating fish; pt sts made herself vomit without relief; pt ate this am

## 2016-11-04 NOTE — ED Notes (Signed)
Dr Erma Heritageisaacs  At the bedside  Examining the pts throat

## 2016-11-04 NOTE — ED Notes (Signed)
lmp 2 weeks ago 

## 2016-11-04 NOTE — ED Provider Notes (Signed)
[facility] DEPT Provider Note   CSN: [id number] Arrival date & time: 11/04/16  1357     History   Chief Complaint Chief Complaint  Patient presents with  . Foreign Body  . Nausea    HPI Ariel Cervantes is a 26 y.o. female.  HPI   26 yo F with h/o GERD, anorexia, here with sore throat and nausea. Pt was eating fish filet yesterday. While eating, she felt a sharp, scratchy feeling in her right throat and started coughing. Since then, she has had persistent sore throat that is worse with swallowing and bending her head back. She has associated nausea and has vomited twice. No abdominal pain, nausea, or vomiting. No other medical complaints. No dysuria or hematuria. No alleviating factors/has not tried anything for this. No h/o esophageal strictures.  Past Medical History:  Diagnosis Date  . ADHD (attention deficit hyperactivity disorder)   . Anorexia nervosa   . Anxiety   . Asthma   . Eczema   . Vaginitis     Patient Active Problem List   Diagnosis Date Noted  . Asthma with acute exacerbation 08/19/2015  . Acute sinusitis 08/19/2015  . Cardiac murmur 07/17/2015  . Crampy pain associated with menses 07/17/2015  . Abnormal presence of protein in urine 07/17/2015  . Perennial and seasonal allergic rhinitis 07/15/2015  . Moderate persistent asthma 07/15/2015  . Abdominal pain 07/02/2015  . ADD (attention deficit disorder) 07/02/2015  . H/O anorexia nervosa 07/02/2015  . H/O anxiety state 07/02/2015  . 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 Anorexia nervosa, binge eating/purging type 11/13/2013  . Anorexia nervosa, restricting type 07/10/2011  . GAD (generalized anxiety disorder) 07/10/2011    History reviewed. No pertinent surgical history.  OB History    No data available       Home Medications    Prior to Admission medications   Medication Sig Start Date End Date Taking? Authorizing  Provider  albuterol (PROAIR HFA) 108 (90 Base) MCG/ACT inhaler Inhale 2 puffs into the lungs every 4 (four) hours as needed for wheezing or shortness of breath. 07/15/15  Yes Bobbitt, Sedalia Muta, MD  beclomethasone (QVAR) 80 MCG/ACT inhaler Inhale 2 puffs into the lungs 2 (two) times daily.   Yes [provider]  budesonide-formoterol (SYMBICORT) 160-4.5 MCG/ACT inhaler Inhale 2 puffs into the lungs 2 (two) times daily. 08/31/16  Yes Bobbitt, Sedalia Muta, MD  dicyclomine (BENTYL) 10 MG capsule Take 10 mg by mouth 3 (three) times daily before meals.    Yes [provider]  EPINEPHrine (EPIPEN 2-PAK) 0.3 mg/0.3 mL IJ SOAJ injection USE AS DIRECTED FOR SEVERE ALLERGIC REACTION 08/09/15  Yes Bobbitt, Sedalia Muta, MD  levonorgestrel-ethinyl estradiol (SEASONALE,INTROVALE,JOLESSA) 0.15-0.03 MG tablet Take 1 tablet by mouth daily.    Yes [provider]  lisdexamfetamine (VYVANSE) 30 MG capsule Take 30 mg by mouth. 06/07/15  Yes [provider]  loratadine (CLARITIN) 10 MG tablet Take 10 mg by mouth daily as needed for allergies.    Yes [provider]  montelukast (SINGULAIR) 10 MG tablet TAKE 1 TABLET BY MOUTH EVERY EVENING TO PREVENT COUGH OR WHEEZE 08/31/16  Yes Valentina Shaggy, MD  ondansetron (ZOFRAN ODT) 4 MG disintegrating tablet Take 1 tablet (4 mg total) by mouth every 8 (eight) hours as needed for nausea or vomiting. 11/04/16   Duffy Bruce, MD  sucralfate (CARAFATE) 1 GM/10ML suspension Take 10 mLs (1 g total) by mouth 4 (  four) times daily -  with meals and at bedtime. 11/04/16 11/11/16  Duffy Bruce, MD    Family History Family History  Problem Relation Age of Onset  . Depression Father   . Anxiety disorder Father   . Allergic rhinitis Father   . Asthma Father   . Allergic rhinitis Mother   . Allergic rhinitis Brother     Social History Social History  Substance Use Topics  . Smoking status: Never Smoker  . Smokeless tobacco: Never  Used  . Alcohol use 2.0 oz/week    4 Standard drinks or equivalent per week     Allergies   Oseltamivir; Sulfa antibiotics; and Sulfamethoxazole   Review of Systems Review of Systems  HENT: Positive for sore throat and trouble swallowing.   Gastrointestinal: Positive for abdominal pain and nausea.  All other systems reviewed and are negative.    Physical Exam Updated Vital Signs BP 122/81   Pulse 87   Temp 99.2 F (37.3 C) (Oral)   Resp 18   LMP 10/21/2016   SpO2 96%   Physical Exam  Constitutional: She is oriented to person, place, and time. She appears well-developed and well-nourished. No distress.  HENT:  Head: Normocephalic and atraumatic.  Moderate posterior pharyngeal erythema. No exudates. No bleeding or trauma. No pooling of secretions.  Eyes: Conjunctivae are normal.  Neck: Neck supple.  Cardiovascular: Normal rate, regular rhythm and normal heart sounds.  Exam reveals no friction rub.   No murmur heard. Pulmonary/Chest: Effort normal and breath sounds normal. No respiratory distress. She has no wheezes. She has no rales.  Abdominal: Soft. Bowel sounds are normal. She exhibits no distension. There is tenderness (mild, epigastric, without rebound or guarding).  Musculoskeletal: She exhibits no edema.  Neurological: She is alert and oriented to person, place, and time. She exhibits normal muscle tone.  Skin: Skin is warm. Capillary refill takes less than 2 seconds.  Psychiatric: She has a normal mood and affect.  Nursing note and vitals reviewed.    ED Treatments / Results  Labs (all labs ordered are listed, but only abnormal results are displayed) Labs Reviewed  I-STAT BETA HCG BLOOD, ED ([facility], [facility], [facility] ONLY)    EKG  EKG Interpretation None       Radiology Dg Neck Soft Tissue  Result Date: 11/04/2016 CLINICAL DATA:  Pt c/o right-sided, anterior neck pain after possibly swallowing a fish bone last night; pt also c/o nausea. Hx of asthma. No hx of GI  problems or previous throat surgeries. Pt is a nonsmoker. EXAM: NECK SOFT TISSUES - 1+ VIEW COMPARISON:  None. FINDINGS: There is no evidence of retropharyngeal soft tissue swelling or epiglottic enlargement. The cervical airway is unremarkable and no radio-opaque foreign body identified. IMPRESSION: Negative. Electronically Signed   By: Nolon Nations M.D.   On: 11/04/2016 18:27   Dg Abdomen Acute W/chest  Result Date: 11/04/2016 CLINICAL DATA:  Nausea. EXAM: DG ABDOMEN ACUTE W/ 1V CHEST COMPARISON:  None. FINDINGS: The lungs are clear without focal pneumonia, edema, pneumothorax or pleural effusion. The cardiopericardial silhouette is within normal limits for size. The visualized bony structures of the thorax are intact. Upright film shows no evidence for intraperitoneal free air. There is no evidence for gaseous bowel dilation to suggest obstruction. No unexpected abdominopelvic calcification. Visualized bony anatomy is unremarkable. IMPRESSION: Negative abdominal radiographs.  No acute cardiopulmonary disease. Electronically Signed   By: Misty Stanley M.D.   On: 11/04/2016 18:30    Procedures Procedures (including  critical care time)   Flexible Bronchoscopy/Laryngoscopy: Following topical anesthesia using lidocaine and benzocaine, the disposable flexible laryngoscope was introduced into the nares atraumatically. The visualized sinuses were unremarkable. The scope was then advanced and the posterior pharynx and larynx were examined in detail. Findings include: moderate edema of right sided vallecula, without visualized FB. No laryngeal edema or asymmetry. The laryngoscope was then removed uneventfully. Images are saved. Patient tolerated the procedure well with no immediate complications.  Medications Ordered in ED Medications  lidocaine (XYLOCAINE) 2 % jelly 1 application (1 application Topical Not Given 11/04/16 1637)  Benzocaine (HURRCAINE) 20 % mouth spray ( Mouth/Throat Not Given 11/04/16 1638)   dexamethasone (DECADRON) tablet 10 mg (not administered)  gi cocktail (Maalox,Lidocaine,Donnatal) (30 mLs Oral Given 11/04/16 1642)  ondansetron (ZOFRAN-ODT) disintegrating tablet 4 mg (4 mg Oral Given 11/04/16 1641)     Initial Impression / Assessment and Plan / ED Course  I have reviewed the triage vital signs and the nursing notes.  Pertinent labs & imaging results that were available during my care of the patient were reviewed by me and considered in my medical decision making (see chart for details).     26 yo F with PMHx as above here with FB/globus sensation of throat after eating fish last night. Bedside flex laryngoscopy shows mild edema c/w possible passed FB. No evidence of perforation. Tolerating PO without difficulty. No signs of free air or FB on plain films. Will tx with anti-inflammatories, antacids, d/c home with outpt GI follow-up as she is o/w tolerating PO, no signs of critical impaction/obstruction.  Final Clinical Impressions(s) / ED Diagnoses   Final diagnoses:  Globus sensation  Other acute gastritis without hemorrhage    New Prescriptions New Prescriptions   ONDANSETRON (ZOFRAN ODT) 4 MG DISINTEGRATING TABLET    Take 1 tablet (4 mg total) by mouth every 8 (eight) hours as needed for nausea or vomiting.   SUCRALFATE (CARAFATE) 1 GM/10ML SUSPENSION    Take 10 mLs (1 g total) by mouth 4 (four) times daily -  with meals and at bedtime.     Duffy Bruce, MD 11/04/16 [phone]

## 2016-11-19 ENCOUNTER — Ambulatory Visit (INDEPENDENT_AMBULATORY_CARE_PROVIDER_SITE_OTHER): Payer: Managed Care, Other (non HMO) | Admitting: Allergy

## 2016-11-19 ENCOUNTER — Encounter: Payer: Self-pay | Admitting: Allergy

## 2016-11-19 VITALS — BP 100/66 | HR 80 | Temp 99.1°F | Resp 16 | Ht 61.0 in | Wt 138.6 lb

## 2016-11-19 DIAGNOSIS — J0110 Acute frontal sinusitis, unspecified: Secondary | ICD-10-CM

## 2016-11-19 DIAGNOSIS — J4541 Moderate persistent asthma with (acute) exacerbation: Secondary | ICD-10-CM

## 2016-11-19 DIAGNOSIS — L2084 Intrinsic (allergic) eczema: Secondary | ICD-10-CM

## 2016-11-19 DIAGNOSIS — J3089 Other allergic rhinitis: Secondary | ICD-10-CM | POA: Diagnosis not present

## 2016-11-19 MED ORDER — AMOXICILLIN-POT CLAVULANATE 875-125 MG PO TABS: 1.0000 | ORAL_TABLET | Freq: Two times a day (BID) | ORAL | 0 refills | Status: DC

## 2016-11-19 MED ORDER — FLUCONAZOLE 150 MG PO TABS: ORAL_TABLET | ORAL | 0 refills | Status: DC

## 2016-11-19 NOTE — Progress Notes (Signed)
Follow-up Note  RE: Ariel Cervantes MRN: [id number] DOB: 10-08-1990 Date of Office Visit: 11/19/2016   History of present illness: Ariel Cervantes is a 26 y.o. female presenting today for sick visit and follow-up of eczema on dupixent, allergic rhinitis, asthma.  She was last seen in the office on 08/17/16 by myself at which time she was treated for a sinusitis with augmentin.   She states for the last 2-3 weeks she has been having increase cloudy nasal drainage which is leading to poor sleep also having frontal HA.  Denies any chills/fever.  Symptoms have not been improving over the past 2-3 weeks. She is using flonase twice a day, singulair and xyzal daily.  Has not been doing saline rinses regularly.  She did try use of Mucinex D which she states did not help her sinus symptoms thus she stopped use.    She also over the last several days been having chest tightness and wheezing.  Using albuterol at least 3 times a week.  No nighttime awakenings.  She takes Symbicort 2 puffs twice a day.   She is on dupixent twice a week injections that her husband gives her alternating back of arms.  No issues with injections.  She has noticed improvement in her hand eczema since starting on Dupixent.    Review of systems: Review of Systems  Constitutional: Positive for malaise/fatigue. Negative for chills and fever.  HENT: Positive for congestion, ear pain and sinus pain. Negative for ear discharge, nosebleeds, sore throat and tinnitus.   Eyes: Negative for discharge and redness.  Respiratory: Positive for cough, shortness of breath and wheezing.   Cardiovascular: Negative for chest pain.  Gastrointestinal: Negative for abdominal pain, constipation, diarrhea, nausea and vomiting.  Musculoskeletal: Negative for joint pain.  Skin: Positive for itching and rash.  Neurological: Positive for headaches. Negative for dizziness.    All other systems negative unless noted above in HPI  Past  medical/social/surgical/family history have been reviewed and are unchanged unless specifically indicated below.  got married in June  Medication List: Allergies as of 11/19/2016      Reactions   Oseltamivir Other (See Comments)   hives   Sulfa Antibiotics Nausea And Vomiting   Sulfamethoxazole Nausea And Vomiting      Medication List       Accurate as of 11/19/16  6:08 PM. Always use your most recent med list.          albuterol 108 (90 Base) MCG/ACT inhaler Commonly known as:  PROAIR HFA Inhale 2 puffs into the lungs every 4 (four) hours as needed for wheezing or shortness of breath.   amoxicillin-clavulanate 875-125 MG tablet Commonly known as:  AUGMENTIN Take 1 tablet by mouth 2 (two) times daily.   budesonide-formoterol 160-4.5 MCG/ACT inhaler Commonly known as:  SYMBICORT Inhale 2 puffs into the lungs 2 (two) times daily.   dicyclomine 10 MG capsule Commonly known as:  BENTYL Take 10 mg by mouth 3 (three) times daily before meals.   EPINEPHrine 0.3 mg/0.3 mL Soaj injection Commonly known as:  EPIPEN 2-PAK USE AS DIRECTED FOR SEVERE ALLERGIC REACTION   esomeprazole 40 MG capsule Commonly known as:  NEXIUM Take 40 mg by mouth 2 (two) times daily.   fluconazole 150 MG tablet Commonly known as:  DIFLUCAN Take 1 tablet at the beginning and 1 tablet after finishing the antibiotic.   fluticasone 50 MCG/ACT nasal spray Commonly known as:  FLONASE Place 2 sprays into both nostrils  2 (two) times daily.   fluvoxaMINE 100 MG tablet Commonly known as:  LUVOX Take 100 mg by mouth at bedtime.   levocetirizine 5 MG tablet Commonly known as:  XYZAL Take 5 mg by mouth every evening.   levonorgestrel-ethinyl estradiol 0.15-0.03 MG tablet Commonly known as:  SEASONALE,INTROVALE,JOLESSA Take 1 tablet by mouth daily.   loratadine 10 MG tablet Commonly known as:  CLARITIN Take 10 mg by mouth daily as needed for allergies.   montelukast 10 MG tablet Commonly known  as:  SINGULAIR TAKE 1 TABLET BY MOUTH EVERY EVENING TO PREVENT COUGH OR WHEEZE   sucralfate 1 GM/10ML suspension Commonly known as:  CARAFATE Take 10 mLs (1 g total) by mouth 4 (four) times daily -  with meals and at bedtime.   traZODone 50 MG tablet Commonly known as:  DESYREL Take 50 mg by mouth 2 (two) times daily.   VYVANSE 30 MG capsule Generic drug:  lisdexamfetamine Take 30 mg by mouth.       Known medication allergies: Allergies  Allergen Reactions  . Oseltamivir Other (See Comments)    hives  . Sulfa Antibiotics Nausea And Vomiting  . Sulfamethoxazole Nausea And Vomiting     Physical examination: Blood pressure 100/66, pulse 80, temperature 99.1 F (37.3 C), temperature source Oral, resp. rate 16, height 5\' 1"  (1.549 m), weight 138 lb 9.6 oz (62.9 kg), last menstrual period 10/21/2016, SpO2 97 %.  General: Alert, interactive, in no acute distress. HEENT: PERRLA, TMs pearly gray, turbinates moderately edematous with clear discharge, post-pharynx non erythematous. Mild ttp over frontal sinus Neck: Supple without lymphadenopathy. Lungs: Clear to auscultation without wheezing, rhonchi or rales. {no increased work of breathing. CV: Normal S1, S2 without murmurs. Abdomen: Nondistended, nontender. Skin: hands well moisturized with mild erythematous patches much improved from previous exam. Extremities:  No clubbing, cyanosis or edema. Neuro:   Grossly intact.  Diagnositics/Labs:  Spirometry: FEV1: 2.79L  96%, FVC: 3.15L  95%, ratio consistent with nonobstructive pattern  Assessment and plan:   Acute sinusitis, frontal  - Take Augmentin 875mg  twice a day x 10 days.  Will also send in Diflucan 150mg  take 1 tab at start of antibiotic course and 1 tab at the end of course.    - take prednisone 20mg  daily x 5 days  - saline rinse daily while still symptomatic and then may use as needed  - will consider obtain sinus CT if you have another sinus infection within the  year.   Asthma, mod persistent with mild exacerbation due to URI  - Continue Symbicort 160mcg 2 puffs each am and pm.   - Albuterol as needed  - Continue Singulair 10mg  each evening Asthma control goals:   Full participation in all desired activities (may need albuterol before activity)  Albuterol use two time or less a week on average (not counting use with activity)  Cough interfering with sleep two time or less a month  Oral steroids no more than once a year  No hospitalizations  Allergic rhinitis  - Continue xyzal daily and Flonase 2 sprays each nostril twice a day  Eczema   - continue Dupixent every 2 week injection   - use Elocon to eczema on hands for flares as needed.       - Daily moisturization to hands.   If needed recommend using cotton gloves at night to keep steroid cream and moisturization in place.    Follow-up 6 months or sooner if needed   I appreciate  the opportunity to take part in Sorrel's care. Please do not hesitate to contact me with questions.  Sincerely,   Prudy Feeler, MD Allergy/Immunology [facility]

## 2016-11-19 NOTE — Patient Instructions (Signed)
Acute sinusitis  - Take Augmentin 875mg  twice a day x 10 days.  Will also send in Diflucan 150mg  take 1 tab at start of antibiotic course and 1 tab at the end of course.    - take prednisone 20mg  daily x 5 days  - saline rinse daily while still symptomatic and then may use as needed  - will consider obtain sinus CT if you have another sinus infection within the year.   Asthma, mod persistent with mild exacerbation due to URI  - Continue Symbicort 160mcg 2 puffs each am and pm.   - Albuterol as needed  - Continue Singulair 10mg  each evening Asthma control goals:   Full participation in all desired activities (may need albuterol before activity)  Albuterol use two time or less a week on average (not counting use with activity)  Cough interfering with sleep two time or less a month  Oral steroids no more than once a year  No hospitalizations  Allergic rhinitis  - Continue xyzal daily and Flonase 2 sprays each nostril twice a day  Eczema   - continue Dupixent every 2 week injection   - use Elocon to eczema on hands for flares as needed.       - Daily moisturization to hands.   If needed recommend using cotton gloves at night to keep steroid cream and moisturization in place.    Follow-up 6 months or sooner if needed

## 2016-11-23 ENCOUNTER — Telehealth: Payer: Self-pay | Admitting: Allergy

## 2016-11-23 DIAGNOSIS — J0100 Acute maxillary sinusitis, unspecified: Secondary | ICD-10-CM

## 2016-11-23 NOTE — Telephone Encounter (Signed)
Pt called and said that Dr. Delorse LekPadgett said that she may need to go for a CT Scan she would like to have that schedule now. [phone]

## 2016-11-25 NOTE — Telephone Encounter (Signed)
Patient was calling because she has not heard anything.

## 2016-11-27 NOTE — Telephone Encounter (Signed)
Patient called for the 3rd time and would like to get this CT scan scheduled before the end of the month.

## 2016-11-27 NOTE — Telephone Encounter (Signed)
Spoke to patient and informed her that [facility] [address]. She understood

## 2016-11-27 NOTE — Telephone Encounter (Signed)
I advised to the pt that your last note states to wait to see if she has another sinus infection. She states the she has met her deductible and her insurance will renew in August. She is concerned about the cost if she were to get one. She is not currently having any symptoms. Please advise.

## 2016-11-30 ENCOUNTER — Ambulatory Visit
Admission: RE | Admit: 2016-11-30 | Discharge: 2016-11-30 | Disposition: A | Discharge status: Home / Self Care | Payer: Managed Care, Other (non HMO) | Source: Ambulatory Visit | Attending: Allergy | Admitting: Allergy

## 2016-11-30 DIAGNOSIS — J0100 Acute maxillary sinusitis, unspecified: Secondary | ICD-10-CM

## 2016-12-01 ENCOUNTER — Telehealth: Payer: Self-pay

## 2016-12-01 NOTE — Telephone Encounter (Signed)
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

## 2016-12-01 NOTE — Telephone Encounter (Signed)
Referral faxed to Dr. Suszanne Connerseoh office.

## 2016-12-07 ENCOUNTER — Telehealth: Payer: Self-pay | Admitting: Allergy

## 2016-12-07 NOTE — Telephone Encounter (Signed)
Patient would like CT results faxed to [facility] at [facility] Fax:: [phone]

## 2016-12-08 NOTE — Telephone Encounter (Signed)
[phone] 

## 2016-12-08 NOTE — Telephone Encounter (Signed)
Left fax number on kaylas laptop

## 2016-12-08 NOTE — Telephone Encounter (Signed)
Will need fax number to send

## 2016-12-08 NOTE — Telephone Encounter (Signed)
Added Dr Verdie DrownPincus to her care team and routed results

## 2016-12-18 NOTE — Telephone Encounter (Signed)
Refaxing referral to Dr. Suszanne Conners, followed up with them and they didn't receive my request.

## 2017-01-01 ENCOUNTER — Other Ambulatory Visit: Payer: Self-pay

## 2017-01-01 MED ORDER — BUDESONIDE-FORMOTEROL FUMARATE 160-4.5 MCG/ACT IN AERO: INHALATION_SPRAY | RESPIRATORY_TRACT | 4 refills | Status: DC

## 2017-02-11 ENCOUNTER — Other Ambulatory Visit: Payer: Self-pay | Admitting: Allergy & Immunology

## 2017-05-09 ENCOUNTER — Other Ambulatory Visit: Payer: Self-pay | Admitting: Allergy and Immunology

## 2017-05-20 ENCOUNTER — Encounter: Payer: Self-pay | Admitting: Allergy

## 2017-05-20 ENCOUNTER — Ambulatory Visit (INDEPENDENT_AMBULATORY_CARE_PROVIDER_SITE_OTHER): Payer: Managed Care, Other (non HMO) | Admitting: Allergy

## 2017-05-20 VITALS — BP 106/68 | HR 78 | Ht 61.0 in | Wt 140.0 lb

## 2017-05-20 DIAGNOSIS — L2089 Other atopic dermatitis: Secondary | ICD-10-CM

## 2017-05-20 DIAGNOSIS — J3089 Other allergic rhinitis: Secondary | ICD-10-CM | POA: Diagnosis not present

## 2017-05-20 DIAGNOSIS — J4540 Moderate persistent asthma, uncomplicated: Secondary | ICD-10-CM

## 2017-05-20 MED ORDER — AZELASTINE HCL 0.1 % NA SOLN
1.0000 | Freq: Two times a day (BID) | NASAL | Status: DC
Start: 2017-05-20 — End: 2017-08-16

## 2017-05-20 NOTE — Patient Instructions (Signed)
Asthma, mod persistent   - Continue Symbicort 160mcg 2 puffs each am and pm.   - Albuterol as needed  - Continue Singulair 10mg  each evening Asthma control goals:   Full participation in all desired activities (may need albuterol before activity)  Albuterol use two time or less a week on average (not counting use with activity)  Cough interfering with sleep two time or less a month  Oral steroids no more than once a year  No hospitalizations  Allergic rhinitis  - Continue xyzal or zyrtec daily  - use Flonase 2 sprays each nostril twice a day as needed for nasal congestion  - use Astelin, nasal antihistamine, 2 sprays each nostril twice a day for nasal drainage/post-nasal drip  - allergen immunotherapy (allergy shots) discussed today including protocol, benefits/risks.  Consent signed today.  Bring your AuviQ on days of your injections.  Take your antihistamine on days of your injections.    Eczema   - continue Dupixent every 2 week injection   - use Elocon to eczema on hands for flares as needed.       - Daily moisturization to hands.   If needed recommend using cotton gloves at night to keep steroid cream and moisturization in place.    Follow-up 6 months or sooner if needed

## 2017-05-20 NOTE — Progress Notes (Signed)
Follow-up Note  RE: Ariel Cervantes MRN: [id number] DOB: 1990-06-26 Date of Office Visit: 05/20/2017   History of present illness: Ariel Cervantes is a 27 y.o. female presenting today for follow-up of asthma, allergic rhinitis and eczema.  She was last seen in the office on 11/19/16 by myself at which time she was treated for sinusitis with augmentin.  She states she has been doing much better in regards to her asthma and eczema.  She continues on symbicort 2 puffs twice a day and singulair daily.  She does state with the cooler weather she is needing to use albuterol about 2 times a week.  She denies any nighttime awakenings, ED/UC visits or oral steroids since completed course from last visit.  She is on zyrtec now as her [facility] does not carry Xyzal.  She does states she has been having more nasal drainage and post nasal drainage that leads to cough.  She is not using any nasal sprays at this time.  She does have flonase but since she is not congested in not using this spray.  She continues on dupixent injections every 2 weeks and remains pleased with the improvement in her hands.  She states this time of year her hand eczema usually is flared.    She is interested in starting on allergen immunotherapy.  She had testing done in 2017 showing sensitivities to tree pollen, grass pollen, molds, cat, cockroach and dust mites.    Review of systems: Review of Systems  Constitutional: Negative for chills, fever and malaise/fatigue.  HENT: Positive for congestion. Negative for ear discharge, ear pain, nosebleeds, sinus pain and sore throat.   Eyes: Negative for pain, discharge and redness.  Respiratory: Positive for cough. Negative for sputum production, shortness of breath and wheezing.   Cardiovascular: Negative for chest pain.  Gastrointestinal: Negative for abdominal pain, constipation, diarrhea, heartburn, nausea and vomiting.  Musculoskeletal: Negative for joint pain and myalgias.    Skin: Negative for itching and rash.  Neurological: Negative for headaches.    All other systems negative unless noted above in HPI  Past medical/social/surgical/family history have been reviewed and are unchanged unless specifically indicated below.  No changes  Medication List: Allergies as of 05/20/2017      Reactions   Oseltamivir Other (See Comments)   hives   Sulfa Antibiotics Nausea And Vomiting   Sulfamethoxazole Nausea And Vomiting      Medication List        Accurate as of 05/20/17 12:26 PM. Always use your most recent med list.          albuterol 108 (90 Base) MCG/ACT inhaler Commonly known as:  PROAIR HFA Inhale 2 puffs into the lungs every 4 (four) hours as needed for wheezing or shortness of breath.   budesonide-formoterol 160-4.5 MCG/ACT inhaler Commonly known as:  SYMBICORT 2 puffs twice daily to prevent coughing or wheezing.   cetirizine 10 MG tablet Commonly known as:  ZYRTEC Take 10 mg by mouth daily.   EPINEPHrine 0.3 mg/0.3 mL Soaj injection Commonly known as:  EPIPEN 2-PAK USE AS DIRECTED FOR SEVERE ALLERGIC REACTION   esomeprazole 40 MG capsule Commonly known as:  NEXIUM Take 40 mg by mouth 2 (two) times daily.   fluticasone 50 MCG/ACT nasal spray Commonly known as:  FLONASE Place 2 sprays into both nostrils 2 (two) times daily.   fluvoxaMINE 100 MG tablet Commonly known as:  LUVOX Take 100 mg by mouth at bedtime.   levonorgestrel-ethinyl estradiol  0.15-0.03 MG tablet Commonly known as:  SEASONALE,INTROVALE,JOLESSA Take 1 tablet by mouth daily.   LINZESS 145 MCG Caps capsule Generic drug:  linaclotide Take 145 mcg by mouth daily before breakfast.   montelukast 10 MG tablet Commonly known as:  SINGULAIR TAKE 1 TABLET BY MOUTH EVERY EVENING TO PREVENT COUGH OR WHEEZE   ranitidine 15 MG/ML syrup Commonly known as:  ZANTAC Take by mouth 2 (two) times daily.   traZODone 50 MG tablet Commonly known as:  DESYREL Take 50 mg by  mouth 2 (two) times daily.   verapamil 40 MG tablet Commonly known as:  CALAN Take 40 mg by mouth 3 (three) times daily.   VYVANSE 30 MG capsule Generic drug:  lisdexamfetamine Take 30 mg by mouth.       Known medication allergies: Allergies  Allergen Reactions  . Oseltamivir Other (See Comments)    hives  . Sulfa Antibiotics Nausea And Vomiting  . Sulfamethoxazole Nausea And Vomiting     Physical examination: Blood pressure 106/68, pulse 78, height 5\' 1"  (1.549 m), weight 140 lb (63.5 kg), SpO2 96 %.  General: Alert, interactive, in no acute distress. HEENT: PERRLA, TMs pearly gray, turbinates moderately edematous with clear discharge, post-pharynx non erythematous. Neck: Supple without lymphadenopathy. Lungs: Clear to auscultation without wheezing, rhonchi or rales. {no increased work of breathing. CV: Normal S1, S2 without murmurs. Abdomen: Nondistended, nontender. Skin: Warm and dry, without lesions or rashes. Hands look great today without any eczematous lesions Extremities:  No clubbing, cyanosis or edema. Neuro:   Grossly intact.  Diagnositics/Labs:  Spirometry: FEV1: 2.95L  99%, FVC: 3.32L  97%, ratio consistent with nonobstructive pattern  Assessment and plan:   Asthma, mod persistent   - Continue Symbicort 160mcg 2 puffs each am and pm.   - Albuterol as needed  - Continue Singulair 10mg  each evening Asthma control goals:   Full participation in all desired activities (may need albuterol before activity)  Albuterol use two time or less a week on average (not counting use with activity)  Cough interfering with sleep two time or less a month  Oral steroids no more than once a year  No hospitalizations  Allergic rhinitis  - Continue xyzal or zyrtec daily  - use Flonase 2 sprays each nostril twice a day as needed for nasal congestion  - use Astelin, nasal antihistamine, 2 sprays each nostril twice a day for nasal drainage/post-nasal drip  - allergen  immunotherapy (allergy shots) discussed today including protocol, benefits/risks.  Consent signed today.  Bring your AuviQ on days of your injections.  Take your antihistamine on days of your injections.    Eczema   - continue Dupixent every 2 week injection   - use Elocon to eczema on hands for flares as needed.       - Daily moisturization to hands.   If needed recommend using cotton gloves at night to keep steroid cream and moisturization in place.    Follow-up 6 months or sooner if needed  I appreciate the opportunity to take part in Kent's care. Please do not hesitate to contact me with questions.  Sincerely,   Margo AyeShaylar Corrin Hingle, MD Allergy/Immunology [facility]

## 2017-05-25 NOTE — Addendum Note (Signed)
Addended by: Lorrin MaisPADGETT, Pegah Segel P on: 05/25/2017 05:48 PM   Modules accepted: Orders

## 2017-05-25 NOTE — Progress Notes (Signed)
VIALS EXP 04-06-19 

## 2017-05-26 DIAGNOSIS — J3089 Other allergic rhinitis: Secondary | ICD-10-CM | POA: Diagnosis not present

## 2017-05-27 DIAGNOSIS — J3089 Other allergic rhinitis: Secondary | ICD-10-CM | POA: Diagnosis not present

## 2017-06-07 ENCOUNTER — Ambulatory Visit (INDEPENDENT_AMBULATORY_CARE_PROVIDER_SITE_OTHER): Payer: Managed Care, Other (non HMO) | Admitting: *Deleted

## 2017-06-07 DIAGNOSIS — J309 Allergic rhinitis, unspecified: Secondary | ICD-10-CM | POA: Diagnosis not present

## 2017-06-07 NOTE — Progress Notes (Signed)
Immunotherapy   Patient Details  Name: Ariel CarryRachel Horton Cervantes MRN: [id number] Date of Birth: Dec 28, 1990  06/07/2017  Ariel Cervantes started injections for  M-C-CR/G-T-M Following schedule: B  Frequency:2 times per week Epi-Pen:Epi-Pen Available  Consent signed and patient instructions given. Patient waited 30 min without problems   Ariel AlmMildred Esaw Cervantes 06/07/2017, 4:12 PM

## 2017-06-10 ENCOUNTER — Ambulatory Visit (INDEPENDENT_AMBULATORY_CARE_PROVIDER_SITE_OTHER): Payer: Managed Care, Other (non HMO) | Admitting: *Deleted

## 2017-06-10 DIAGNOSIS — J309 Allergic rhinitis, unspecified: Secondary | ICD-10-CM

## 2017-06-14 ENCOUNTER — Ambulatory Visit (INDEPENDENT_AMBULATORY_CARE_PROVIDER_SITE_OTHER): Payer: Managed Care, Other (non HMO) | Admitting: *Deleted

## 2017-06-14 DIAGNOSIS — J309 Allergic rhinitis, unspecified: Secondary | ICD-10-CM

## 2017-06-15 ENCOUNTER — Other Ambulatory Visit: Payer: Self-pay | Admitting: Allergy and Immunology

## 2017-06-17 ENCOUNTER — Ambulatory Visit (INDEPENDENT_AMBULATORY_CARE_PROVIDER_SITE_OTHER): Payer: Managed Care, Other (non HMO) | Admitting: *Deleted

## 2017-06-17 DIAGNOSIS — J309 Allergic rhinitis, unspecified: Secondary | ICD-10-CM | POA: Diagnosis not present

## 2017-06-21 ENCOUNTER — Ambulatory Visit (INDEPENDENT_AMBULATORY_CARE_PROVIDER_SITE_OTHER): Payer: Managed Care, Other (non HMO) | Admitting: *Deleted

## 2017-06-21 DIAGNOSIS — J309 Allergic rhinitis, unspecified: Secondary | ICD-10-CM

## 2017-06-23 ENCOUNTER — Other Ambulatory Visit: Payer: Self-pay | Admitting: Allergy and Immunology

## 2017-06-23 DIAGNOSIS — J4540 Moderate persistent asthma, uncomplicated: Secondary | ICD-10-CM

## 2017-06-28 ENCOUNTER — Ambulatory Visit (INDEPENDENT_AMBULATORY_CARE_PROVIDER_SITE_OTHER): Payer: Managed Care, Other (non HMO)

## 2017-06-28 DIAGNOSIS — J309 Allergic rhinitis, unspecified: Secondary | ICD-10-CM | POA: Diagnosis not present

## 2017-07-01 ENCOUNTER — Ambulatory Visit (INDEPENDENT_AMBULATORY_CARE_PROVIDER_SITE_OTHER): Payer: Managed Care, Other (non HMO) | Admitting: *Deleted

## 2017-07-01 DIAGNOSIS — J309 Allergic rhinitis, unspecified: Secondary | ICD-10-CM | POA: Diagnosis not present

## 2017-07-12 ENCOUNTER — Ambulatory Visit (INDEPENDENT_AMBULATORY_CARE_PROVIDER_SITE_OTHER): Payer: Managed Care, Other (non HMO) | Admitting: *Deleted

## 2017-07-12 DIAGNOSIS — J309 Allergic rhinitis, unspecified: Secondary | ICD-10-CM

## 2017-07-16 ENCOUNTER — Ambulatory Visit (INDEPENDENT_AMBULATORY_CARE_PROVIDER_SITE_OTHER): Payer: Managed Care, Other (non HMO)

## 2017-07-16 DIAGNOSIS — J309 Allergic rhinitis, unspecified: Secondary | ICD-10-CM | POA: Diagnosis not present

## 2017-07-20 ENCOUNTER — Ambulatory Visit (INDEPENDENT_AMBULATORY_CARE_PROVIDER_SITE_OTHER): Payer: Managed Care, Other (non HMO) | Admitting: *Deleted

## 2017-07-20 DIAGNOSIS — J309 Allergic rhinitis, unspecified: Secondary | ICD-10-CM

## 2017-07-24 ENCOUNTER — Emergency Department (HOSPITAL_COMMUNITY): Payer: Managed Care, Other (non HMO)

## 2017-07-24 ENCOUNTER — Emergency Department (HOSPITAL_COMMUNITY)
Admission: EM | Admit: 2017-07-24 | Discharge: 2017-07-24 | Disposition: A | Discharge status: Home / Self Care | Payer: Managed Care, Other (non HMO) | Attending: Emergency Medicine | Admitting: Emergency Medicine

## 2017-07-24 ENCOUNTER — Encounter (HOSPITAL_COMMUNITY): Payer: Self-pay | Admitting: Emergency Medicine

## 2017-07-24 ENCOUNTER — Other Ambulatory Visit: Payer: Self-pay

## 2017-07-24 DIAGNOSIS — J45909 Unspecified asthma, uncomplicated: Secondary | ICD-10-CM | POA: Diagnosis not present

## 2017-07-24 DIAGNOSIS — R079 Chest pain, unspecified: Secondary | ICD-10-CM | POA: Diagnosis present

## 2017-07-24 DIAGNOSIS — R110 Nausea: Secondary | ICD-10-CM | POA: Diagnosis not present

## 2017-07-24 DIAGNOSIS — Z79899 Other long term (current) drug therapy: Secondary | ICD-10-CM | POA: Insufficient documentation

## 2017-07-24 LAB — COMPREHENSIVE METABOLIC PANEL
ALT: 21 U/L (ref 14–54)
ANION GAP: 10 (ref 5–15)
AST: 21 U/L (ref 15–41)
Albumin: 3.8 g/dL (ref 3.5–5.0)
Alkaline Phosphatase: 46 U/L (ref 38–126)
BUN: 8 mg/dL (ref 6–20)
CHLORIDE: 104 mmol/L (ref 101–111)
CO2: 22 mmol/L (ref 22–32)
Calcium: 9.2 mg/dL (ref 8.9–10.3)
Creatinine, Ser: 0.9 mg/dL (ref 0.44–1.00)
Glucose, Bld: 98 mg/dL (ref 65–99)
POTASSIUM: 4.1 mmol/L (ref 3.5–5.1)
Sodium: 136 mmol/L (ref 135–145)
Total Bilirubin: 0.6 mg/dL (ref 0.3–1.2)
Total Protein: 7.2 g/dL (ref 6.5–8.1)

## 2017-07-24 LAB — URINALYSIS, ROUTINE W REFLEX MICROSCOPIC
BILIRUBIN URINE: NEGATIVE
Glucose, UA: NEGATIVE mg/dL
HGB URINE DIPSTICK: NEGATIVE
Ketones, ur: NEGATIVE mg/dL
LEUKOCYTES UA: NEGATIVE
Nitrite: NEGATIVE
PROTEIN: 30 mg/dL — AB
Specific Gravity, Urine: 1.02 (ref 1.005–1.030)
pH: 6 (ref 5.0–8.0)

## 2017-07-24 LAB — CBC
HEMATOCRIT: 47.1 % — AB (ref 36.0–46.0)
HEMOGLOBIN: 16.2 g/dL — AB (ref 12.0–15.0)
MCH: 30.9 pg (ref 26.0–34.0)
MCHC: 34.4 g/dL (ref 30.0–36.0)
MCV: 89.7 fL (ref 78.0–100.0)
Platelets: 353 10*3/uL (ref 150–400)
RBC: 5.25 MIL/uL — ABNORMAL HIGH (ref 3.87–5.11)
RDW: 12.7 % (ref 11.5–15.5)
WBC: 8.4 10*3/uL (ref 4.0–10.5)

## 2017-07-24 LAB — I-STAT TROPONIN, ED: Troponin i, poc: 0 ng/mL (ref 0.00–0.08)

## 2017-07-24 LAB — I-STAT BETA HCG BLOOD, ED (MC, WL, AP ONLY)

## 2017-07-24 LAB — LIPASE, BLOOD: Lipase: 28 U/L (ref 11–51)

## 2017-07-24 MED ORDER — GI COCKTAIL ~~LOC~~
30.0000 mL | Freq: Once | ORAL | Status: AC
Start: 2017-07-24 — End: 2017-07-24
  Administered 2017-07-24: 30 mL via ORAL
  Filled 2017-07-24: qty 30

## 2017-07-24 NOTE — ED Notes (Signed)
Declined W/C at D/C and was escorted to lobby by RN. 

## 2017-07-24 NOTE — ED Provider Notes (Signed)
[facility] EMERGENCY DEPARTMENT Provider Note   CSN: [id number] Arrival date & time: 07/24/17  8295     History   Chief Complaint Chief Complaint  Patient presents with  . Chest Pain  . Emesis  . Nausea    HPI Ariel Cervantes is a 27 y.o. female.  HPI   Ariel Cervantes is a 26 year old female with a history of GERD, anxiety, anorexia, asthma, ADHD who presents to the emergency department for evaluation of chest discomfort and nausea.  Patient reports that her symptoms began 2 days ago and have been persistent.  States that she has central chest fullness, as if "I swallowed a pill and it's just sitting there."  Does not member choking on anything and is able to swallow food/liquid without difficulty.  States that the pain has been constant for the past 2 days and is about a 4/10 in severity.  Pain does not radiate.  Seems to be worsened with swallowing in particular.  States that she also has associated nausea and feeling like she is not hungry.  She had one episode of vomiting 2 days ago, but has not had any vomiting since.  She has been taking her prescribed Nexium and Zantac without much improvement.  States that she was seen by a GI specialist about a month ago and had a manometry and pH study which showed that she has reflux.  Is following up with an esophageal specialist next month.  She denies associated shortness of breath, diaphoresis, lightheadedness.  She denies fever, chills, cough, congestion, sore throat, abdominal pain, diarrhea, dysuria, urinary frequency. She denies pleuritic chest pain, history of DVT/PE, leg swelling or calf tenderness, recent travel or immobility.  She does take oral contraceptive medication.  Denies recreational drug use.   Past Medical History:  Diagnosis Date  . ADHD (attention deficit hyperactivity disorder)   . Anorexia nervosa   . Anxiety   . Asthma   . Eczema   . Vaginitis     Patient Active Problem List   Diagnosis  Date Noted  . Asthma with acute exacerbation 08/19/2015  . Acute sinusitis 08/19/2015  . Cardiac murmur 07/17/2015  . Crampy pain associated with menses 07/17/2015  . Abnormal presence of protein in urine 07/17/2015  . Perennial and seasonal allergic rhinitis 07/15/2015  . Moderate persistent asthma 07/15/2015  . Abdominal pain 07/02/2015  . ADD (attention deficit disorder) 07/02/2015  . H/O anorexia nervosa 07/02/2015  . H/O anxiety state 07/02/2015  . 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 Anorexia nervosa, binge eating/purging type 11/13/2013  . Anorexia nervosa, restricting type 07/10/2011  . GAD (generalized anxiety disorder) 07/10/2011    History reviewed. No pertinent surgical history.   OB History   None      Home Medications    Prior to Admission medications   Medication Sig Start Date End Date Taking? Authorizing Provider  budesonide-formoterol (SYMBICORT) 160-4.5 MCG/ACT inhaler INHALE 2 PUFFS INTO THE LUNGS TWICE A DAY TO PREVENT COUGHING OR WHEEZING 06/16/17  Yes Bobbitt, Heywood Iles, MD  cetirizine (ZYRTEC) 10 MG tablet Take 10 mg by mouth daily.   Yes [provider]  EPINEPHrine (EPIPEN 2-PAK) 0.3 mg/0.3 mL IJ SOAJ injection USE AS DIRECTED FOR SEVERE ALLERGIC REACTION 08/09/15  Yes Bobbitt, Heywood Iles, MD  esomeprazole (NEXIUM) 40 MG capsule Take 40 mg by mouth 2 (two) times daily.   Yes [provider]  fluticasone (FLONASE) 50 MCG/ACT  nasal spray Place 2 sprays into both nostrils daily as needed for allergies.    Yes [provider]  fluvoxaMINE (LUVOX) 100 MG tablet Take 200 mg by mouth every morning.    Yes [provider]  levonorgestrel-ethinyl estradiol (SEASONALE,INTROVALE,JOLESSA) 0.15-0.03 MG tablet Take 1 tablet by mouth daily.    Yes [provider]  linaclotide (LINZESS) 145 MCG CAPS capsule Take 145 mcg by mouth daily before  breakfast.   Yes [provider]  lisdexamfetamine (VYVANSE) 40 MG capsule Take 40 mg by mouth every morning.  06/07/15  Yes [provider]  montelukast (SINGULAIR) 10 MG tablet TAKE 1 TABLET BY MOUTH EVERY EVENING TO PREVENT COUGH OR WHEEZE Patient taking differently: TAKE 10mg  BY MOUTH EVERY EVENING TO PREVENT COUGH OR WHEEZE 05/10/17  Yes Bobbitt, Heywood Ilesalph Carter, MD  ranitidine (ZANTAC) 150 MG tablet Take 150 mg by mouth at bedtime as needed for heartburn.    Yes [provider]  traZODone (DESYREL) 50 MG tablet Take 50 mg by mouth at bedtime.    Yes [provider]  VENTOLIN HFA 108 (90 Base) MCG/ACT inhaler USE 2 INHALATION BY MOUTH EVERY 4 HOURS AS NEEDED FOR WHEEZING OR SHORTNESS OF BREATH 06/23/17  Yes Bobbitt, Heywood Ilesalph Carter, MD  verapamil (CALAN) 40 MG tablet Take 40 mg by mouth at bedtime.    Yes [provider]    Family History Family History  Problem Relation Age of Onset  . Depression Father   . Anxiety disorder Father   . Allergic rhinitis Father   . Asthma Father   . Allergic rhinitis Mother   . Allergic rhinitis Brother     Social History Social History   Tobacco Use  . Smoking status: Never Smoker  . Smokeless tobacco: Never Used  Substance Use Topics  . Alcohol use: Yes    Alcohol/week: 2.0 oz    Types: 4 Standard drinks or equivalent per week  . Drug use: No     Allergies   Oseltamivir; Sulfa antibiotics; and Sulfamethoxazole   Review of Systems Review of Systems  Constitutional: Negative for chills, diaphoresis and fever.  HENT: Negative for congestion, rhinorrhea, sore throat and trouble swallowing.   Respiratory: Negative for cough and shortness of breath.   Cardiovascular: Positive for chest pain. Negative for palpitations and leg swelling.  Gastrointestinal: Positive for nausea and vomiting. Negative for abdominal pain, blood in stool and diarrhea.  Genitourinary: Negative for difficulty urinating, dysuria,  frequency, hematuria and vaginal discharge.  Musculoskeletal: Negative for back pain.  Skin: Negative for rash.  Neurological: Negative for dizziness and light-headedness.  Psychiatric/Behavioral: Negative for agitation.     Physical Exam Updated Vital Signs BP 126/68 (BP Location: Right Arm)   Pulse 78   Temp 98.8 F (37.1 C) (Oral)   Resp 16   Ht 5\' 1"  (1.549 m)   Wt 63.5 kg (140 lb)   SpO2 98%   BMI 26.45 kg/m   Physical Exam  Constitutional: She is oriented to person, place, and time. She appears well-developed and well-nourished. No distress.  HENT:  Head: Normocephalic and atraumatic.  Mouth/Throat: Oropharynx is clear and moist. No oropharyngeal exudate.  Eyes: Pupils are equal, round, and reactive to light. Conjunctivae are normal. Right eye exhibits no discharge. Left eye exhibits no discharge.  Neck: Normal range of motion. Neck supple.  Cardiovascular: Normal rate, regular rhythm and intact distal pulses.  Pulmonary/Chest: Effort normal and breath sounds normal. No stridor. No respiratory distress. She has no  wheezes. She has no rales.  Tender to palpation over the sternum.  No overlying rash or bruising.  Abdominal: Soft. Bowel sounds are normal. There is no tenderness.  Musculoskeletal:  No leg swelling or calf tenderness.  Neurological: She is alert and oriented to person, place, and time. Coordination normal.  Skin: Skin is warm and dry. Capillary refill takes less than 2 seconds. She is not diaphoretic.  Psychiatric: She has a normal mood and affect. Her behavior is normal.  Nursing note and vitals reviewed.    ED Treatments / Results  Labs (all labs ordered are listed, but only abnormal results are displayed) Labs Reviewed  CBC - Abnormal; Notable for the following components:      Result Value   RBC 5.25 (*)    Hemoglobin 16.2 (*)    HCT 47.1 (*)    All other components within normal limits  URINALYSIS, ROUTINE W REFLEX MICROSCOPIC - Abnormal;  Notable for the following components:   APPearance HAZY (*)    Protein, ur 30 (*)    Bacteria, UA RARE (*)    Squamous Epithelial / LPF 0-5 (*)    All other components within normal limits  LIPASE, BLOOD  COMPREHENSIVE METABOLIC PANEL  I-STAT BETA HCG BLOOD, ED ([facility], [facility], [facility] ONLY)  I-STAT TROPONIN, ED    EKG None  Radiology Dg Chest 2 View  Result Date: 07/24/2017 CLINICAL DATA:  Chest pain for the past week. EXAM: CHEST - 2 VIEW COMPARISON:  Chest x-ray dated April 01, 2017. FINDINGS: The heart size and mediastinal contours are within normal limits. Both lungs are clear. Incidental note is made of an azygos lobe. The visualized skeletal structures are unremarkable. IMPRESSION: Normal chest x-ray. Electronically Signed   By: Obie Dredge M.D.   On: 07/24/2017 09:11    Procedures Procedures (including critical care time)  Medications Ordered in ED Medications  gi cocktail (Maalox,Lidocaine,Donnatal) (30 mLs Oral Given 07/24/17 1225)     Initial Impression / Assessment and Plan / ED Course  I have reviewed the triage vital signs and the nursing notes.  Pertinent labs & imaging results that were available during my care of the patient were reviewed by me and considered in my medical decision making (see chart for details).    Patient presents with chest "fullness" which has been constant for the past 2 days now.  She states that her pain seems to be worse with swallowing.  Pain does not radiate.  On exam she is afebrile and vital signs stable.  Nontoxic-appearing.  Chest mildly tender to palpation.  She has no abdominal tenderness.  I suspect the patient's symptoms are related to GERD given worsening with swallowing and her history of GERD in the past.  No concern for ACS at this time given presentation, EKG nonischemic and troponin negative with patient having constant symptoms for the past 2 days now.  Do not suspect PE given patient is not tachycardic, tachypneic, no prior  history of DVT/PE, no pleuritic chest pain, no recent surgery or immobilization, no calf swelling or leg tenderness. VSS, patient able to swallow without difficulty, do not suspect esophageal rupture. CXR without acute abnormality, do not suspect pneumothorax, pneumonia.   Labs were drawn, given patient complaining of nausea.  CMP within normal limits, no major electrolyte abnormalities.  CBC unremarkable.  Lipase negative.  UA does not appear infected.  I-STAT beta hCG negative. reviewed.     Patient given a GI cocktail in the emergency department and  had subsequent improvement in her symptoms.  She is able to tolerate p.o. fluids in the emergency department without vomiting.  Have counseled her to follow-up with her GI doctor for further medication management of her GERD.  She also has an appointment next month with an esophageal doctor.  Counseled patient on return precautions and she agrees and voiced understanding to the plan.  She has no complaints prior to discharge.  Final Clinical Impressions(s) / ED Diagnoses   Final diagnoses:  Chest pain, unspecified type  Nausea    ED Discharge Orders    None       Lawrence Marseilles 07/25/17 0902    Terrilee Files, MD 07/26/17 1357

## 2017-07-24 NOTE — Discharge Instructions (Addendum)
Your blood work, EKG and chest x-ray were all reassuring.  Please keep your appointment with the GI doctor for further follow-up.  Continue taking your prescribed Nexium and Zantac.  Return to the emergency department if you have any new or concerning symptoms including worsening of chest pain, chest pain with shortness of breath, vomiting that does not stop her you have any new or concerning symptoms.

## 2017-07-24 NOTE — ED Triage Notes (Signed)
Pt. Stated, Ive had N/V with indigestion and making my chest hurt.

## 2017-07-25 ENCOUNTER — Other Ambulatory Visit: Payer: Self-pay | Admitting: Allergy and Immunology

## 2017-07-26 ENCOUNTER — Ambulatory Visit (INDEPENDENT_AMBULATORY_CARE_PROVIDER_SITE_OTHER): Payer: Managed Care, Other (non HMO) | Admitting: *Deleted

## 2017-07-26 DIAGNOSIS — J309 Allergic rhinitis, unspecified: Secondary | ICD-10-CM | POA: Diagnosis not present

## 2017-07-29 ENCOUNTER — Ambulatory Visit (INDEPENDENT_AMBULATORY_CARE_PROVIDER_SITE_OTHER): Payer: Managed Care, Other (non HMO) | Admitting: *Deleted

## 2017-07-29 DIAGNOSIS — J309 Allergic rhinitis, unspecified: Secondary | ICD-10-CM | POA: Diagnosis not present

## 2017-08-03 ENCOUNTER — Other Ambulatory Visit: Payer: Self-pay | Admitting: Allergy

## 2017-08-03 ENCOUNTER — Ambulatory Visit (INDEPENDENT_AMBULATORY_CARE_PROVIDER_SITE_OTHER): Payer: Managed Care, Other (non HMO) | Admitting: *Deleted

## 2017-08-03 DIAGNOSIS — L2089 Other atopic dermatitis: Secondary | ICD-10-CM

## 2017-08-03 DIAGNOSIS — J309 Allergic rhinitis, unspecified: Secondary | ICD-10-CM

## 2017-08-10 ENCOUNTER — Ambulatory Visit (INDEPENDENT_AMBULATORY_CARE_PROVIDER_SITE_OTHER): Payer: Managed Care, Other (non HMO) | Admitting: *Deleted

## 2017-08-10 DIAGNOSIS — J309 Allergic rhinitis, unspecified: Secondary | ICD-10-CM

## 2017-08-12 ENCOUNTER — Ambulatory Visit (INDEPENDENT_AMBULATORY_CARE_PROVIDER_SITE_OTHER): Payer: Managed Care, Other (non HMO) | Admitting: *Deleted

## 2017-08-12 DIAGNOSIS — J309 Allergic rhinitis, unspecified: Secondary | ICD-10-CM | POA: Diagnosis not present

## 2017-08-16 ENCOUNTER — Encounter: Payer: Self-pay | Admitting: Allergy

## 2017-08-16 ENCOUNTER — Ambulatory Visit (INDEPENDENT_AMBULATORY_CARE_PROVIDER_SITE_OTHER): Payer: Managed Care, Other (non HMO) | Admitting: Allergy

## 2017-08-16 VITALS — BP 102/68 | HR 102 | Temp 99.2°F | Resp 18

## 2017-08-16 DIAGNOSIS — L2089 Other atopic dermatitis: Secondary | ICD-10-CM | POA: Diagnosis not present

## 2017-08-16 DIAGNOSIS — J3089 Other allergic rhinitis: Secondary | ICD-10-CM | POA: Diagnosis not present

## 2017-08-16 DIAGNOSIS — J4540 Moderate persistent asthma, uncomplicated: Secondary | ICD-10-CM

## 2017-08-16 DIAGNOSIS — J0100 Acute maxillary sinusitis, unspecified: Secondary | ICD-10-CM | POA: Diagnosis not present

## 2017-08-16 MED ORDER — FLUCONAZOLE 150 MG PO TABS: ORAL_TABLET | ORAL | 0 refills | Status: AC

## 2017-08-16 MED ORDER — AZELASTINE HCL 0.1 % NA SOLN: 2.0000 | Freq: Two times a day (BID) | NASAL | 5 refills | Status: AC

## 2017-08-16 MED ORDER — AMOXICILLIN-POT CLAVULANATE 875-125 MG PO TABS: 1.0000 | ORAL_TABLET | Freq: Two times a day (BID) | ORAL | 0 refills | Status: AC

## 2017-08-16 NOTE — Patient Instructions (Addendum)
Sinusitis   - start Augmentin 875mg  twice a day x 10 days  - diflucan at start and end of antibiotics  - monitor for fevers - ibuprofen or tylenol  - adequate hydration  - may use Mucinex 1200mg  1-2 times a day with plenty of water to help thin/loosen mucus if needed  Asthma, mod persistent   - Continue Symbicort 160mcg 2 puffs each am and pm.   - Albuterol as needed  - Continue Singulair 10mg  each evening Asthma control goals:   Full participation in all desired activities (may need albuterol before activity)  Albuterol use two time or less a week on average (not counting use with activity)  Cough interfering with sleep two time or less a month  Oral steroids no more than once a year  No hospitalizations  Allergic rhinitis  - Continue xyzal or zyrtec daily  - use Flonase 2 sprays each nostril twice a day as needed for nasal congestion  - use Astelin, nasal antihistamine, 2 sprays each nostril twice a day for nasal drainage/post-nasal drip  - allergen immunotherapy (allergy shots) discussed today including protocol, benefits/risks.  Consent signed today.  Bring your AuviQ on days of your injections.  Take your antihistamine on days of your injections.    Eczema   - continue Dupixent every 2 week injection   - use Elocon to eczema on hands for flares as needed.       - Daily moisturization to hands.   If needed recommend using cotton gloves at night to keep steroid cream and moisturization in place.    Follow-up 6 months or sooner if needed

## 2017-08-16 NOTE — Progress Notes (Signed)
Follow-up Note  RE: Pattye Meda MRN: [id number] DOB: 10-Nov-1990 Date of Office Visit: 08/16/2017   History of present illness: Ariel Cervantes is a 27 y.o. female presenting today for sick visit.  She was last seen in the office on May 20, 2017 by myself. She states she has been feeling sick about a week now.  Symptoms started with worsening postnasal drip with cough.  She then started to feel chills and having low-grade fever.  She states yesterday she woke up and felt "terrible" and had a sore throat.  She is also been having sinus pressure and pain in her frontal and maxillary sinuses.  She states components of this illness does feel similar to her previous sinus infections.  She states most of her drainage is going down the back of her throat but when she was blowing things out about 2 weeks ago the drainage was milky cloudy.  She continues to take her routine medications of Symbicort 2 puffs twice a day as well as Singulair, Zyrtec daily and Flonase.  She has not needed to use her albuterol.  No nighttime awakenings.  She states the Astelin was never sent to her pharmacy after last visit but she does not have this to use. She continues on allergen immunotherapy as well as Dupixent injections every 2 weeks.  Review of systems: Review of Systems  Constitutional: Positive for chills and malaise/fatigue.  HENT: Positive for congestion, sinus pain and sore throat. Negative for ear discharge, ear pain and nosebleeds.   Eyes: Negative for pain, discharge and redness.  Respiratory: Negative for cough, shortness of breath and wheezing.   Cardiovascular: Negative for chest pain.  Gastrointestinal: Negative for abdominal pain, constipation, diarrhea, heartburn, nausea and vomiting.  Musculoskeletal: Negative for joint pain.  Skin: Positive for itching and rash.  Neurological: Negative for headaches.    All other systems negative unless noted above in HPI  Past  medical/social/surgical/family history have been reviewed and are unchanged unless specifically indicated below.  No changes  Medication List: Allergies as of 08/16/2017      Reactions   Oseltamivir Other (See Comments)   hives   Sulfa Antibiotics Nausea And Vomiting   Sulfamethoxazole Nausea And Vomiting      Medication List        Accurate as of 08/16/17 12:49 PM. Always use your most recent med list.          amoxicillin-clavulanate 875-125 MG tablet Commonly known as:  AUGMENTIN Take 1 tablet by mouth 2 (two) times daily.   azelastine 0.1 % nasal spray Commonly known as:  ASTELIN Place 2 sprays into both nostrils 2 (two) times daily. Use in each nostril as directed   budesonide-formoterol 160-4.5 MCG/ACT inhaler Commonly known as:  SYMBICORT INHALE 2 PUFFS INTO THE LUNGS TWICE A DAY TO PREVENT COUGHING OR WHEEZING   cetirizine 10 MG tablet Commonly known as:  ZYRTEC Take 10 mg by mouth daily.   EPINEPHrine 0.3 mg/0.3 mL Soaj injection Commonly known as:  EPIPEN 2-PAK USE AS DIRECTED FOR SEVERE ALLERGIC REACTION   esomeprazole 40 MG capsule Commonly known as:  NEXIUM Take 40 mg by mouth 2 (two) times daily.   fluconazole 150 MG tablet Commonly known as:  DIFLUCAN 1 tab at start of antibiotics and 1 tab at end of antibiotics.   fluticasone 50 MCG/ACT nasal spray Commonly known as:  FLONASE Place 2 sprays into both nostrils daily as needed for allergies.   fluvoxaMINE 100 MG  tablet Commonly known as:  LUVOX Take 200 mg by mouth every morning.   levonorgestrel-ethinyl estradiol 0.15-0.03 MG tablet Commonly known as:  SEASONALE,INTROVALE,JOLESSA Take 1 tablet by mouth daily.   LINZESS 145 MCG Caps capsule Generic drug:  linaclotide Take 145 mcg by mouth daily before breakfast.   mometasone 0.1 % cream Commonly known as:  ELOCON APPLY TO AFFECTED AREA(S) DAILY   montelukast 10 MG tablet Commonly known as:  SINGULAIR TAKE 10mg  BY MOUTH EVERY EVENING  TO PREVENT COUGH OR WHEEZE   ranitidine 150 MG tablet Commonly known as:  ZANTAC Take 150 mg by mouth at bedtime as needed for heartburn.   traZODone 50 MG tablet Commonly known as:  DESYREL Take 50 mg by mouth at bedtime.   VENTOLIN HFA 108 (90 Base) MCG/ACT inhaler Generic drug:  albuterol USE 2 INHALATION BY MOUTH EVERY 4 HOURS AS NEEDED FOR WHEEZING OR SHORTNESS OF BREATH   verapamil 40 MG tablet Commonly known as:  CALAN Take 40 mg by mouth at bedtime.   VYVANSE 40 MG capsule Generic drug:  lisdexamfetamine Take 40 mg by mouth every morning.       Known medication allergies: Allergies  Allergen Reactions  . Oseltamivir Other (See Comments)    hives  . Sulfa Antibiotics Nausea And Vomiting  . Sulfamethoxazole Nausea And Vomiting     Physical examination: Blood pressure 102/68, pulse (!) 102, temperature 99.2 F (37.3 C), temperature source Oral, resp. rate 18, SpO2 97 %.  General: Alert, interactive, in no acute distress. HEENT: PERRLA, TMs pearly gray, turbinates mildly edematous without discharge, post-pharynx non erythematous. Neck: Supple without lymphadenopathy. Lungs: Clear to auscultation without wheezing, rhonchi or rales. {no increased work of breathing. CV: Normal S1, S2 without murmurs. Abdomen: Nondistended, nontender. Skin: Warm and dry, without lesions or rashes.  About a quarter size area of peeling to her palms Extremities:  No clubbing, cyanosis or edema. Neuro:   Grossly intact.  Diagnositics/Labs: None today  Assessment and plan:   Acute sinusitis, maxillary and frontal  - start Augmentin 875mg  twice a day x 10 days  - diflucan at start and end of antibiotics  - monitor for fevers - ibuprofen or tylenol  - adequate hydration  - may use Mucinex 1200mg  1-2 times a day with plenty of water to help thin/loosen mucus if needed  Asthma, mod persistent   - Continue Symbicort 2 puffs each am and pm.   - Albuterol as needed  -  Continue Singulair 10mg  each evening Asthma control goals:   Full participation in all desired activities (may need albuterol before activity)  Albuterol use two time or less a week on average (not counting use with activity)  Cough interfering with sleep two time or less a month  Oral steroids no more than once a year  No hospitalizations  Allergic rhinitis  - Continue xyzal or zyrtec daily  - use Flonase 2 sprays each nostril twice a day as needed for nasal congestion  - use Astelin, nasal antihistamine, 2 sprays each nostril twice a day for nasal drainage/post-nasal drip  - allergen immunotherapy (allergy shots) discussed today including protocol, benefits/risks.  Consent signed today.  Bring your AuviQ on days of your injections.  Take your antihistamine on days of your injections.    Eczema   - continue Dupixent every 2 week injection   - use Elocon to eczema on hands for flares as needed.       - Daily moisturization to hands.  If needed recommend using cotton gloves at night to keep steroid cream and moisturization in place.    Follow-up 6 months or sooner if needed  I appreciate the opportunity to take part in Jere's care. Please do not hesitate to contact me with questions.  Sincerely,   Margo AyeShaylar Genevive Printup, MD Allergy/Immunology [facility]

## 2018-04-05 ENCOUNTER — Other Ambulatory Visit: Payer: Self-pay | Admitting: *Deleted

## 2018-04-05 DIAGNOSIS — L2089 Other atopic dermatitis: Secondary | ICD-10-CM

## 2018-06-13 ENCOUNTER — Other Ambulatory Visit: Payer: Self-pay | Admitting: Allergy

## 2018-06-14 NOTE — Telephone Encounter (Signed)
Please advise 

## 2019-05-13 IMAGING — CR DG CHEST 2V
2 series · 2 of 2 positions shown · non-contrast
Comparison: Chest x-ray dated April 01, 2017.

CLINICAL DATA: Chest pain for the past week.

EXAM:
CHEST - 2 VIEW

[chest pa]
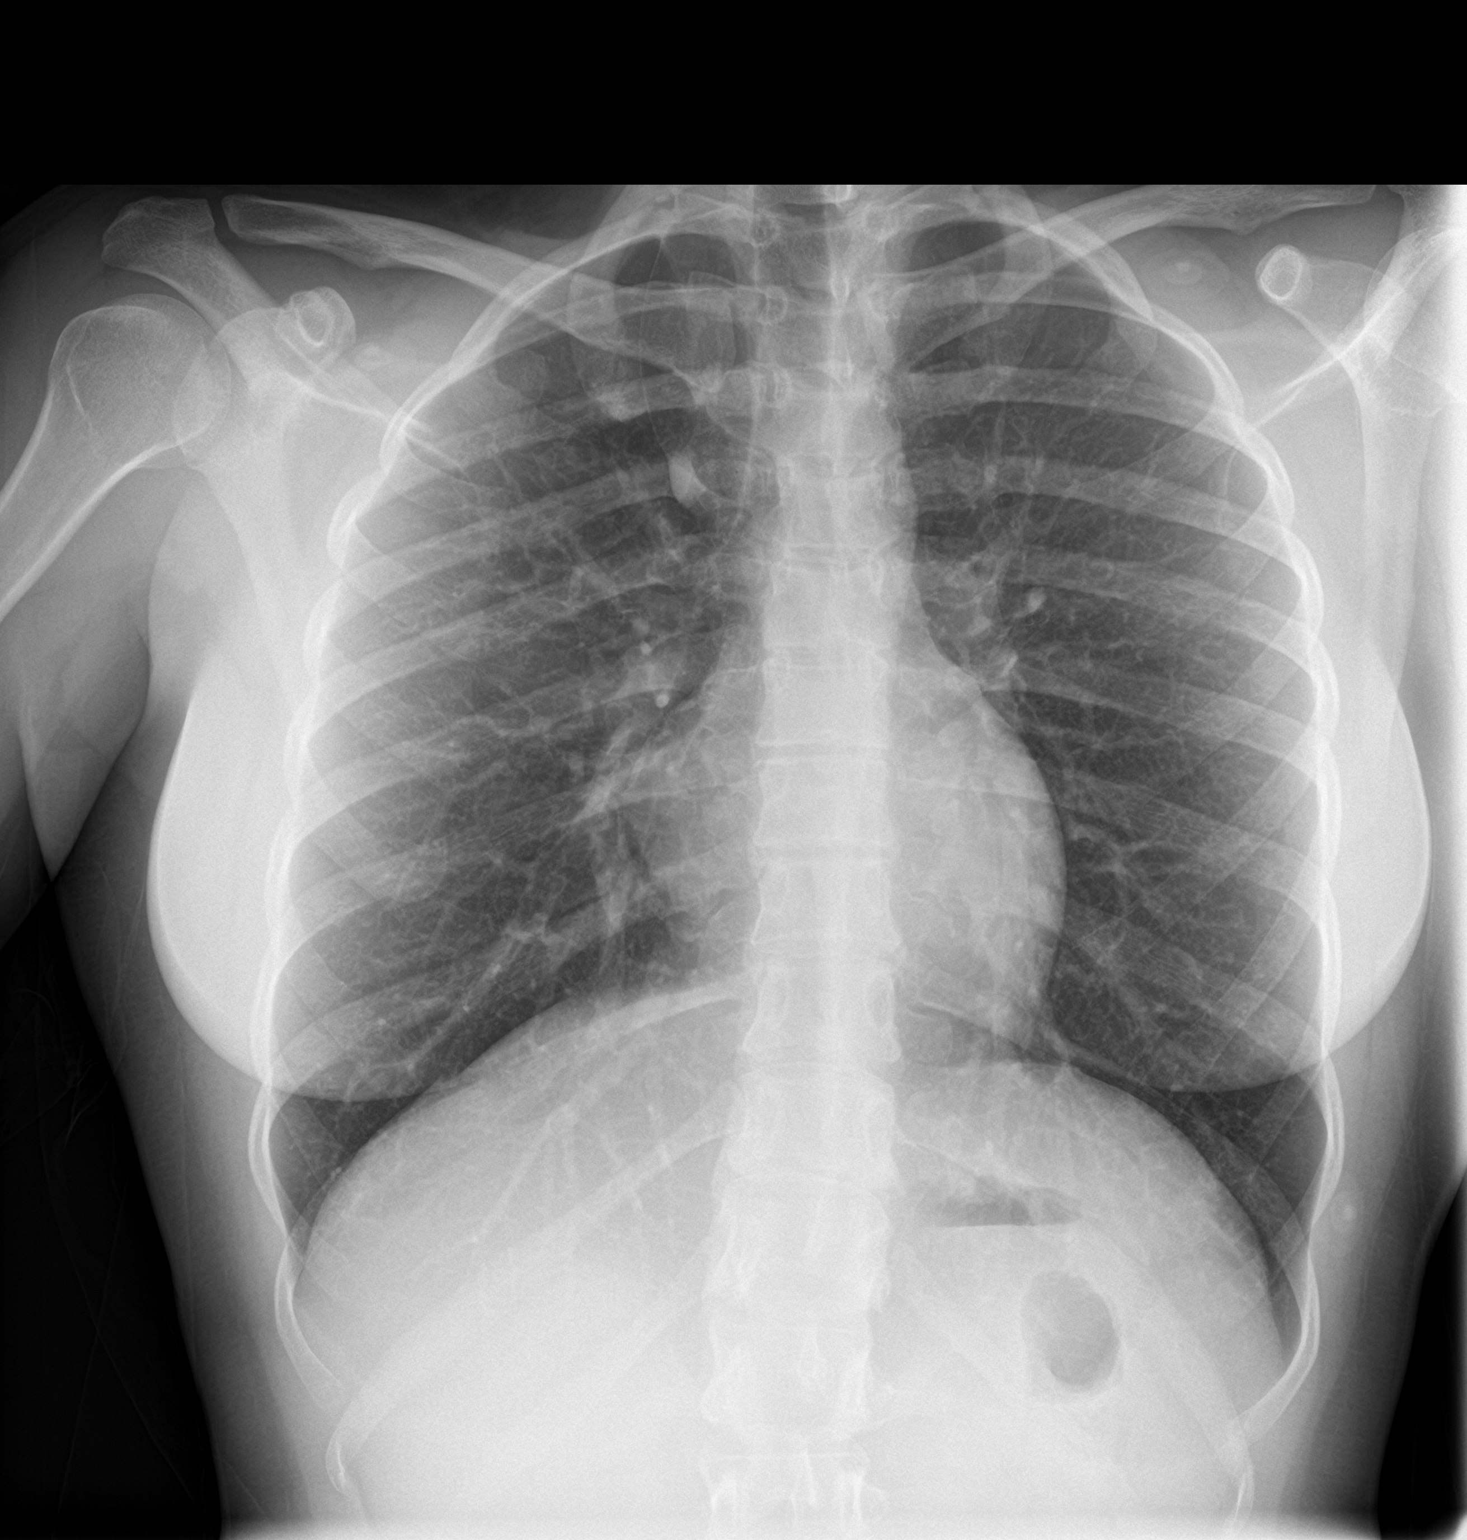

[chest lat]
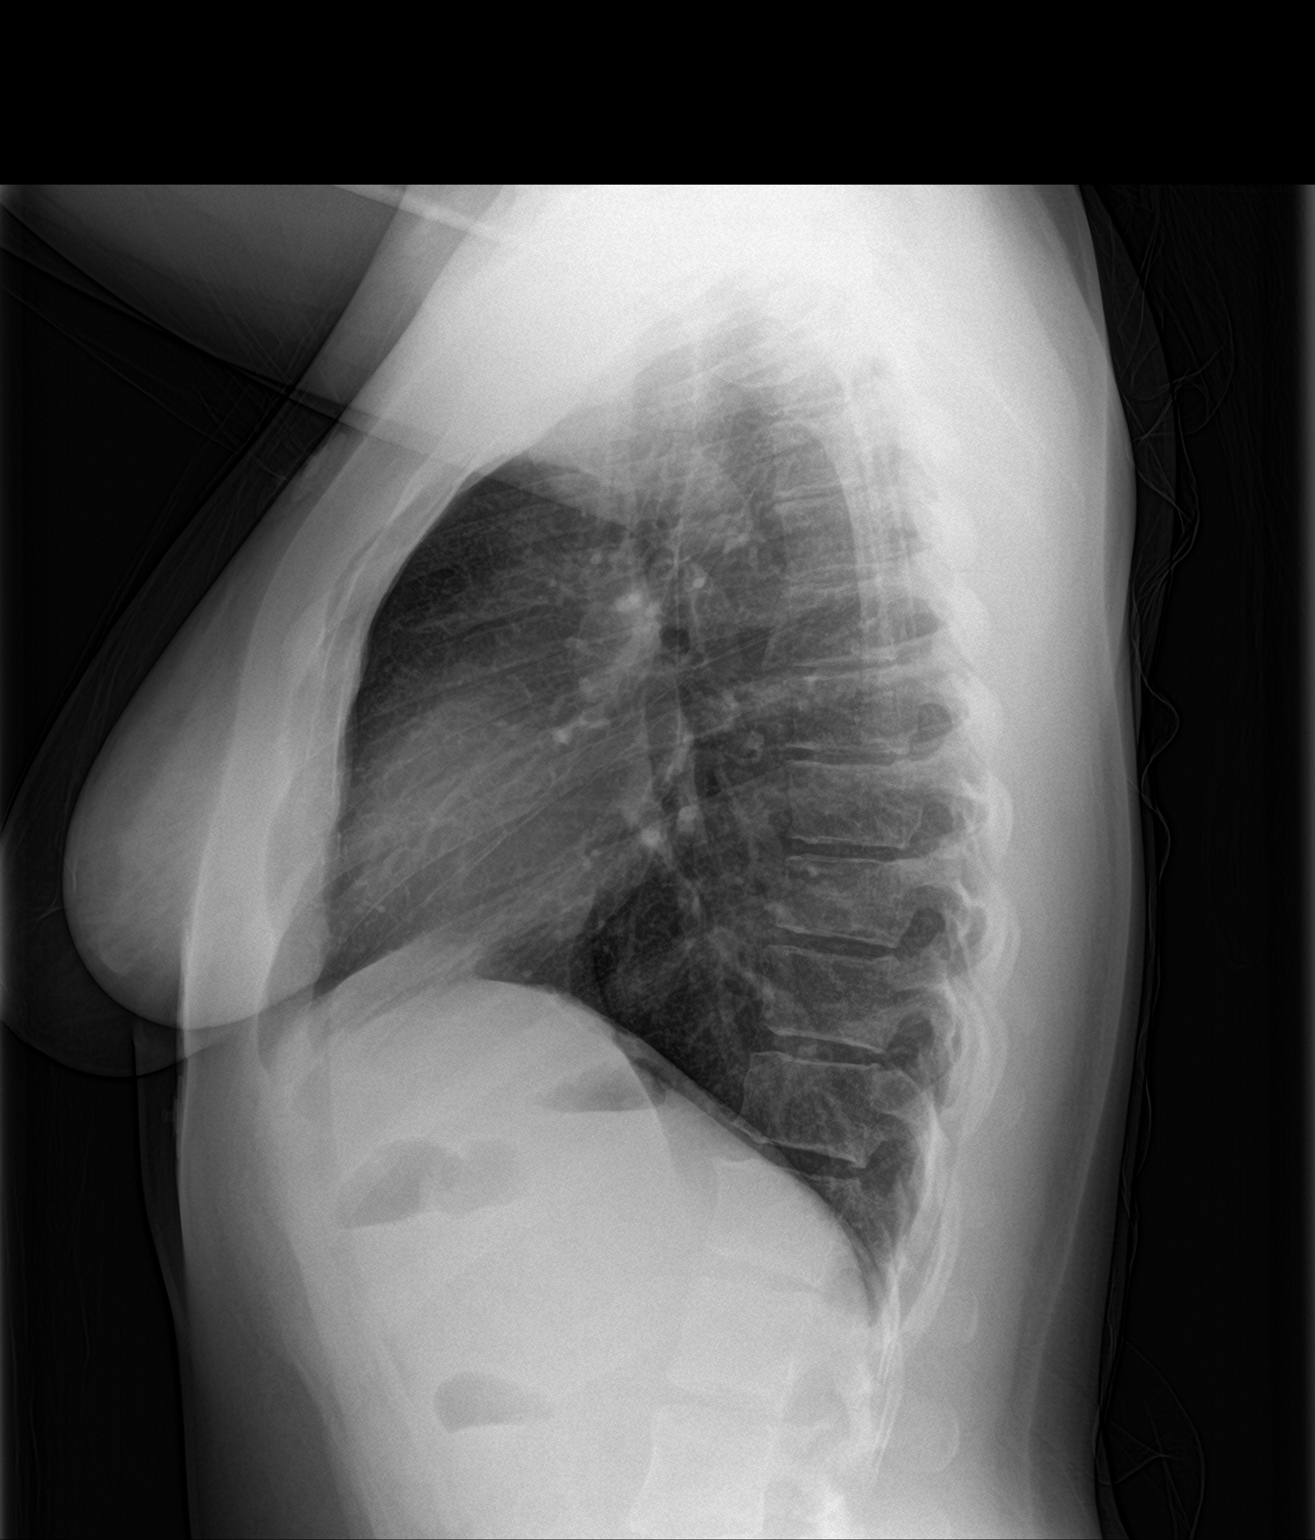

[2 of 2 positions shown; findings below may reference images not displayed]

FINDINGS: The heart size and mediastinal contours are within normal limits.
Both lungs are clear. Incidental note is made of an azygos lobe. The
visualized skeletal structures are unremarkable.
IMPRESSION: Normal chest x-ray.
